# Patient Record
Sex: Male | Born: 1970 | Race: Black or African American | Hispanic: No | Marital: Married | Smoking: Never smoker
Health system: Southern US, Community
[De-identification: ages and names within clinical notes are randomized; demographics above are authoritative.]

## PROBLEM LIST (undated history)

## (undated) DIAGNOSIS — C801 Malignant (primary) neoplasm, unspecified: Secondary | ICD-10-CM

## (undated) DIAGNOSIS — M1990 Unspecified osteoarthritis, unspecified site: Secondary | ICD-10-CM

## (undated) DIAGNOSIS — E119 Type 2 diabetes mellitus without complications: Secondary | ICD-10-CM

## (undated) DIAGNOSIS — I10 Essential (primary) hypertension: Secondary | ICD-10-CM

## (undated) HISTORY — PX: NO PAST SURGERIES: SHX2092

---

## 2006-04-23 ENCOUNTER — Ambulatory Visit (HOSPITAL_BASED_OUTPATIENT_CLINIC_OR_DEPARTMENT_OTHER): Admission: RE | Admit: 2006-04-23 | Discharge: 2006-04-23 | Payer: Self-pay | Admitting: Urology

## 2013-04-30 ENCOUNTER — Telehealth: Payer: Self-pay | Admitting: Family Medicine

## 2013-05-05 NOTE — Telephone Encounter (Signed)
Give janumet samples and have patient make appointment Needs to have labs in order to get 90 day supply.

## 2013-05-05 NOTE — Telephone Encounter (Signed)
Last A1C 09/13

## 2015-09-03 ENCOUNTER — Ambulatory Visit (INDEPENDENT_AMBULATORY_CARE_PROVIDER_SITE_OTHER): Payer: 59 | Admitting: Family Medicine

## 2015-09-03 ENCOUNTER — Ambulatory Visit (INDEPENDENT_AMBULATORY_CARE_PROVIDER_SITE_OTHER): Payer: 59

## 2015-09-03 VITALS — BP 117/76 | HR 60 | Temp 97.9°F | Ht 66.0 in | Wt 237.0 lb

## 2015-09-03 DIAGNOSIS — M6588 Other synovitis and tenosynovitis, other site: Secondary | ICD-10-CM | POA: Diagnosis not present

## 2015-09-03 DIAGNOSIS — M79672 Pain in left foot: Secondary | ICD-10-CM

## 2015-09-03 DIAGNOSIS — M7752 Other enthesopathy of left foot: Secondary | ICD-10-CM

## 2015-09-03 MED ORDER — DICLOFENAC SODIUM 75 MG PO TBEC: 75.0000 mg | DELAYED_RELEASE_TABLET | Freq: Two times a day (BID) | ORAL | Status: DC

## 2015-09-03 NOTE — Progress Notes (Signed)
   Subjective:  Patient ID: Andres Carter, male    DOB: 04-Nov-1971  Age: 44 y.o. MRN: [id number]  CC: Foot Pain   HPI Andres Carter presents for increasing pain at the posterior foot near the achilles.Hurts to walk on it for extended times. Not painful at rest. NKI. Has to waslk a lot.  History Andres Carter has no past medical history on file.   He has no past surgical history on file.   His family history includes Cancer in his father; Diabetes in his father and mother.He reports that he has never smoked. He does not have any smokeless tobacco history on file. He reports that he does not drink alcohol or use illicit drugs.  No outpatient prescriptions prior to visit.   No facility-administered medications prior to visit.    ROS Review of Systems  Constitutional: Negative for fever, chills and diaphoresis.  HENT: Negative for congestion, rhinorrhea and sore throat.   Respiratory: Negative for cough, shortness of breath and wheezing.   Cardiovascular: Negative for chest pain.  Gastrointestinal: Negative for nausea, vomiting, abdominal pain, diarrhea, constipation and abdominal distention.  Genitourinary: Negative for dysuria and frequency.  Musculoskeletal: Negative for joint swelling and arthralgias.  Skin: Negative for rash.  Neurological: Negative for headaches.    Objective:  BP 117/76 mmHg  Pulse 60  Temp(Src) 97.9 F (36.6 C) (Oral)  Ht 5\' 6"  (1.676 m)  Wt 237 lb (107.502 kg)  BMI 38.27 kg/m2  BP Readings from Last 3 Encounters:  09/03/15 117/76    Wt Readings from Last 3 Encounters:  09/03/15 237 lb (107.502 kg)     Physical Exam  Constitutional: He appears well-developed and well-nourished.  HENT:  Head: Normocephalic and atraumatic.  Right Ear: External ear normal.  Left Ear: External ear normal.  Mouth/Throat: No oropharyngeal exudate or posterior oropharyngeal erythema.  Eyes: Pupils are equal, round, and reactive to light.  Neck: Normal  range of motion. Neck supple.  Cardiovascular: Normal rate and regular rhythm.   No murmur heard. Pulmonary/Chest: Breath sounds normal. No respiratory distress.  Abdominal: Bowel sounds are normal.  Musculoskeletal: He exhibits tenderness (medial aspect of the left ankle at the achilles.).  Vitals reviewed.   No results found for: HGBA1C  No results found for: WBC, HGB, HCT, PLT, GLUCOSE, CHOL, TRIG, HDL, LDLDIRECT, LDLCALC, ALT, AST, NA, K, CL, CREATININE, BUN, CO2, TSH, PSA, INR, GLUF, HGBA1C, MICROALBUR  No results found.  Assessment & Plan:   Andres Carter was seen today for foot pain.  Diagnoses and all orders for this visit:  Tendonitis of ankle, left -     Apply ASO ankle  Left foot pain -     DG Foot Complete Left -     Apply ASO ankle  Other orders -     diclofenac (VOLTAREN) 75 MG EC tablet; Take 1 tablet (75 mg total) by mouth 2 (two) times daily.   I am having Andres Carter start on diclofenac.  Meds ordered this encounter  Medications  . diclofenac (VOLTAREN) 75 MG EC tablet    Sig: Take 1 tablet (75 mg total) by mouth 2 (two) times daily.    Dispense:  60 tablet    Refill:  2     Follow-up: Return if symptoms worsen or fail to improve.  Claretta Fraise, M.D.

## 2016-12-13 IMAGING — CR DG FOOT COMPLETE 3+V*L*
3 series · 3 of 3 positions shown · non-contrast
Comparison: None.

CLINICAL DATA: Left foot pain

EXAM:
LEFT FOOT - COMPLETE 3+ VIEW

[view not recorded (1 of 3)]
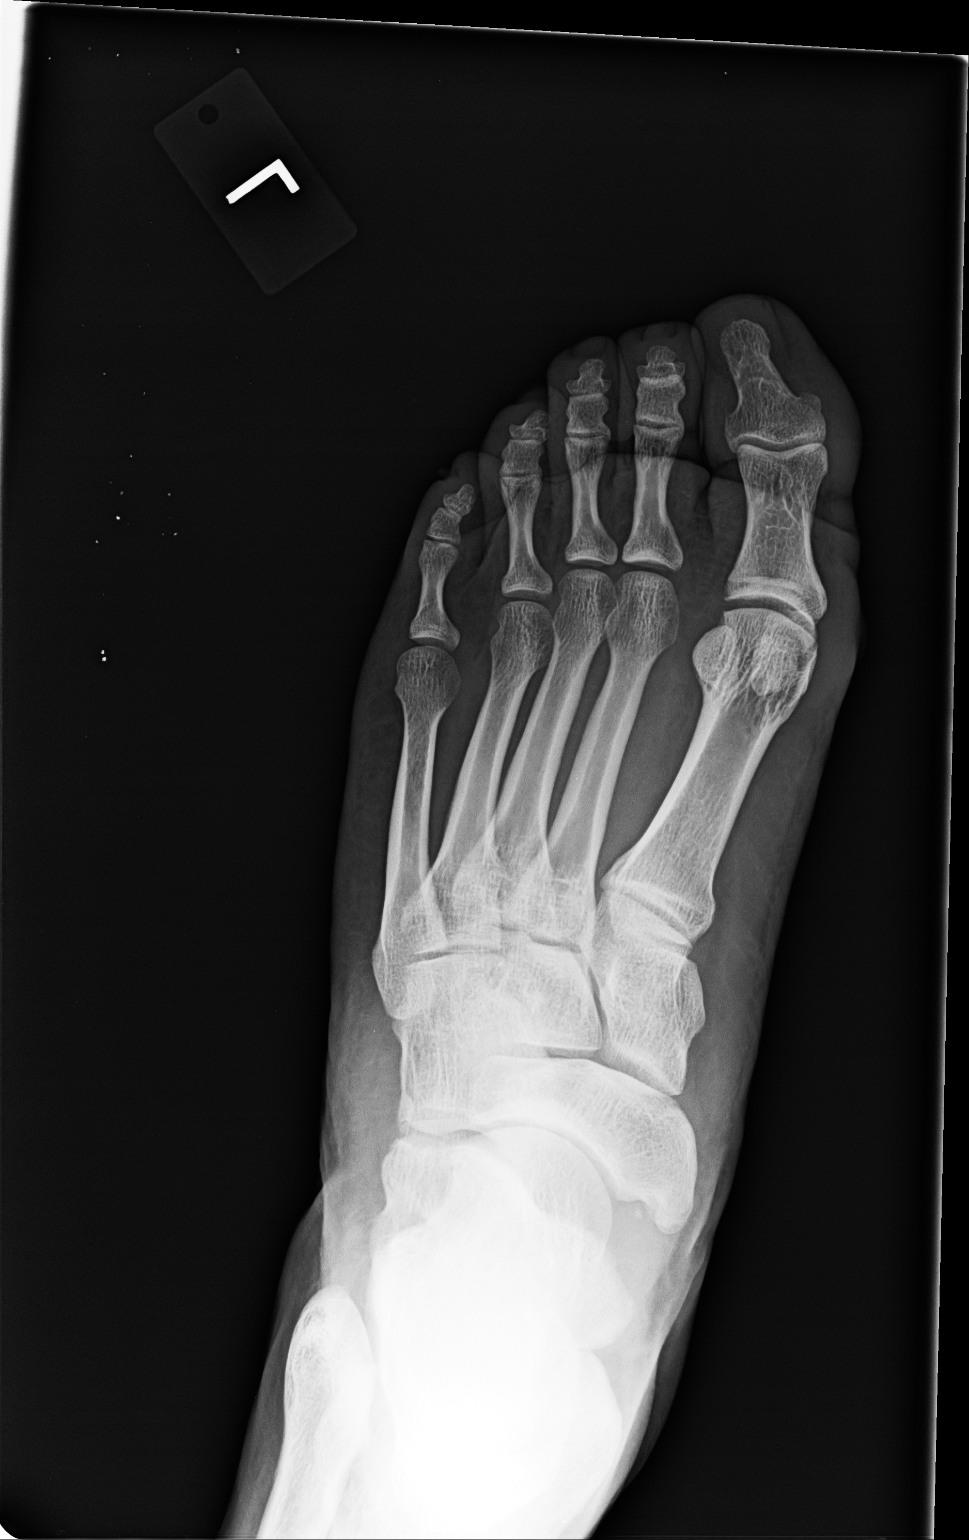

[view not recorded (2 of 3)]
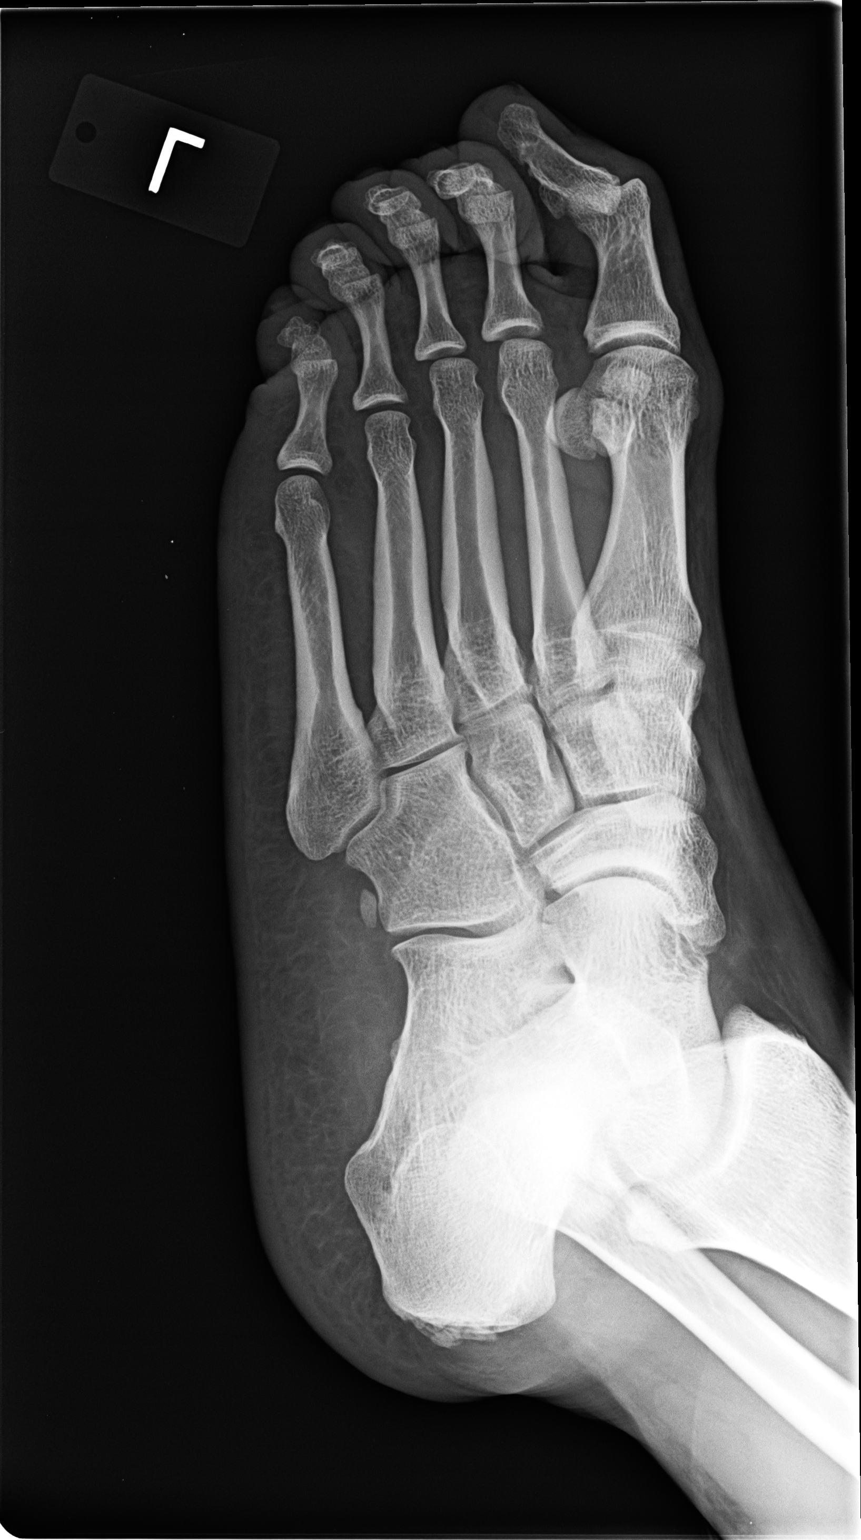

[view not recorded (3 of 3)]
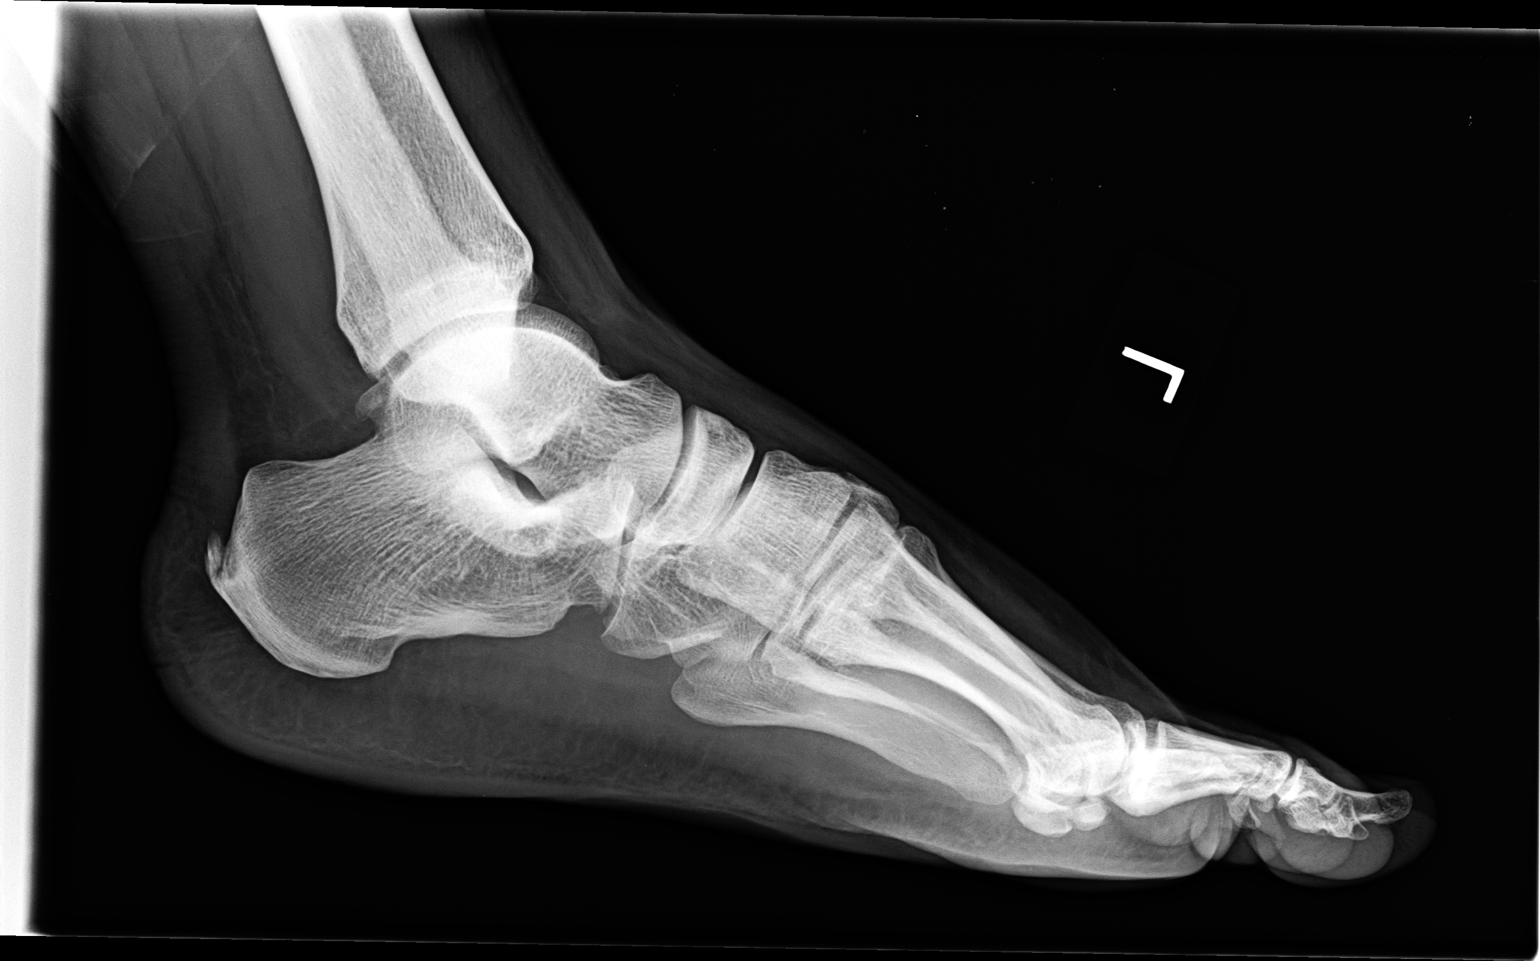

[3 of 3 positions shown; findings below may reference images not displayed]

FINDINGS: Normal alignment. No fracture or arthropathy. Mild spurring at the
Achilles tendon insertion on the calcaneus.
IMPRESSION: Mild calcaneal spurring.  No acute bony abnormality.

## 2022-03-09 DIAGNOSIS — E1165 Type 2 diabetes mellitus with hyperglycemia: Secondary | ICD-10-CM | POA: Insufficient documentation

## 2022-03-09 DIAGNOSIS — R7989 Other specified abnormal findings of blood chemistry: Secondary | ICD-10-CM | POA: Insufficient documentation

## 2022-03-09 DIAGNOSIS — E785 Hyperlipidemia, unspecified: Secondary | ICD-10-CM | POA: Insufficient documentation

## 2022-04-18 ENCOUNTER — Ambulatory Visit: Payer: Self-pay | Admitting: Urology

## 2022-05-01 ENCOUNTER — Ambulatory Visit: Payer: Self-pay | Admitting: Urology

## 2022-05-08 ENCOUNTER — Encounter: Payer: Self-pay | Admitting: Urology

## 2022-05-08 ENCOUNTER — Ambulatory Visit (INDEPENDENT_AMBULATORY_CARE_PROVIDER_SITE_OTHER): Payer: 59 | Admitting: Urology

## 2022-05-08 VITALS — BP 117/73 | HR 111 | Ht 66.5 in | Wt 215.0 lb

## 2022-05-08 DIAGNOSIS — R972 Elevated prostate specific antigen [PSA]: Secondary | ICD-10-CM

## 2022-05-08 LAB — MICROSCOPIC EXAMINATION
Bacteria, UA: NONE SEEN
Renal Epithel, UA: NONE SEEN /hpf
WBC, UA: NONE SEEN /hpf (ref 0–5)

## 2022-05-08 LAB — URINALYSIS, ROUTINE W REFLEX MICROSCOPIC
Bilirubin, UA: NEGATIVE
Glucose, UA: NEGATIVE
Leukocytes,UA: NEGATIVE
Nitrite, UA: NEGATIVE
Specific Gravity, UA: 1.025 (ref 1.005–1.030)
Urobilinogen, Ur: 0.2 mg/dL (ref 0.2–1.0)
pH, UA: 5 (ref 5.0–7.5)

## 2022-05-08 MED ORDER — LEVOFLOXACIN 750 MG PO TABS: 750.0000 mg | ORAL_TABLET | Freq: Once | ORAL | 0 refills | Status: AC

## 2022-05-08 NOTE — Progress Notes (Signed)
05/08/2022 3:50 PM   Andres Carter 1971/01/07 [id number]  Referring provider: Claretta Fraise, MD [place],  [place] [address]  Elevated PSA   HPI:  Andres Carter is a 51yo here for evaluation of elevated PSA. PSA 14.6 in 03/2022. No prior PSAs. No family hx of prostate cancer. No significant LUTS. IPSS 14 QOL 0. No other complaints today.   PMH: No past medical history on file.  Surgical History:   Home Medications:  Allergies as of 05/08/2022   No Known Allergies      Medication List        Accurate as of May 08, 2022  3:50 PM. If you have any questions, ask your nurse or doctor.          atorvastatin 10 MG tablet Commonly known as: LIPITOR Take 10 mg by mouth at bedtime.   B-12 500 MCG Subl Place 1 tablet under the tongue daily.   diclofenac 75 MG EC tablet Commonly known as: VOLTAREN Take 1 tablet (75 mg total) by mouth 2 (two) times daily.   gabapentin 100 MG capsule Commonly known as: NEURONTIN TAKE 1 CAPSULE BY MOUTH NIGHTLY AS NEEDED   lisinopril 5 MG tablet Commonly known as: ZESTRIL Take 5 mg by mouth daily.   metFORMIN 500 MG tablet Commonly known as: GLUCOPHAGE Take 500 mg by mouth 2 (two) times daily.   Ozempic (0.25 or 0.5 MG/DOSE) 2 MG/1.5ML Sopn Generic drug: Semaglutide(0.25 or 0.'5MG'$ /DOS) SMARTSIG:0.25 Milligram(s) SUB-Q Once a Week   sildenafil 100 MG tablet Commonly known as: VIAGRA Take 100 mg by mouth daily.        Allergies: No Known Allergies  Family History: Family History  Problem Relation Age of Onset   Diabetes Mother    Cancer Father    Diabetes Father     Social History:  reports that he has never smoked. He does not have any smokeless tobacco history on file. He reports that he does not drink alcohol and does not use drugs.  ROS: All other review of systems were reviewed and are negative except what is noted above in HPI  Physical Exam: BP 117/73   Pulse (!) 111   Ht 5' 6.5" (1.689 m)    Wt 215 lb (97.5 kg)   BMI 34.18 kg/m   Constitutional:  Alert and oriented, No acute distress. HEENT: [place] AT, moist mucus membranes.  Trachea midline, no masses. Cardiovascular: No clubbing, cyanosis, or edema. Respiratory: Normal respiratory effort, no increased work of breathing. GI: Abdomen is soft, nontender, nondistended, no abdominal masses GU: No CVA tenderness. Circumcised phallus. No masses/lesions on penis, testis, scrotum. Prostate 40g smooth no nodules no induration.  Lymph: No cervical or inguinal lymphadenopathy. Skin: No rashes, bruises or suspicious lesions. Neurologic: Grossly intact, no focal deficits, moving all 4 extremities. Psychiatric: Normal mood and affect.  Laboratory Data: No results found for: "WBC", "HGB", "HCT", "MCV", "PLT"  No results found for: "CREATININE"  No results found for: "PSA"  No results found for: "TESTOSTERONE"  No results found for: "HGBA1C"  Urinalysis No results found for: "COLORURINE", "APPEARANCEUR", "LABSPEC", "PHURINE", "GLUCOSEU", "HGBUR", "BILIRUBINUR", "KETONESUR", "PROTEINUR", "UROBILINOGEN", "NITRITE", "LEUKOCYTESUR"  No results found for: "LABMICR", "WBCUA", "RBCUA", "LABEPIT", "MUCUS", "BACTERIA"  Pertinent Imaging:  No results found for this or any previous visit.  No results found for this or any previous visit.  No results found for this or any previous visit.  No results found for this or any previous visit.  No  results found for this or any previous visit.  No results found for this or any previous visit.  No results found for this or any previous visit.  No results found for this or any previous visit.   Assessment & Plan:    1. Elevated PSA The patient and I talked about etiologies of elevated PSA.  We discussed the possible relationship between elevated PSA, prostate cancer, BPH, prostatitis, and UTI.   Conservative treatment of elevated PSA with watchful waiting was discussed with the patient.   All questions were answered.        All of the risks and benefits along with alternatives to prostate biopsy were discussed with the patient.  The patient gave fully informed consent to proceed with a transrectal ultrasound guided biopsy of the prostate for the evaluation of their evated PSA.  Prostate biopsy instructions and antibiotics were given to the patient.  - Urinalysis, Routine w reflex microscopic   No follow-ups on file.  Nicolette Bang, MD  [facility] [place]

## 2022-05-24 ENCOUNTER — Encounter (HOSPITAL_COMMUNITY): Payer: Self-pay

## 2022-05-24 ENCOUNTER — Ambulatory Visit (HOSPITAL_BASED_OUTPATIENT_CLINIC_OR_DEPARTMENT_OTHER): Payer: 59 | Admitting: Urology

## 2022-05-24 ENCOUNTER — Encounter: Payer: Self-pay | Admitting: Urology

## 2022-05-24 ENCOUNTER — Ambulatory Visit (HOSPITAL_COMMUNITY)
Admission: RE | Admit: 2022-05-24 | Discharge: 2022-05-24 | Disposition: A | Discharge status: Home / Self Care | Payer: 59 | Source: Ambulatory Visit | Attending: Urology | Admitting: Urology

## 2022-05-24 ENCOUNTER — Other Ambulatory Visit: Payer: Self-pay | Admitting: Urology

## 2022-05-24 DIAGNOSIS — R972 Elevated prostate specific antigen [PSA]: Secondary | ICD-10-CM | POA: Insufficient documentation

## 2022-05-24 DIAGNOSIS — C61 Malignant neoplasm of prostate: Secondary | ICD-10-CM

## 2022-05-24 MED ORDER — GENTAMICIN SULFATE 40 MG/ML IJ SOLN
INTRAMUSCULAR | Status: AC
  Administered 2022-05-24: 80 mg via INTRAMUSCULAR
  Filled 2022-05-24: qty 2

## 2022-05-24 MED ORDER — GENTAMICIN SULFATE 40 MG/ML IJ SOLN: 80.0000 mg | Freq: Once | INTRAMUSCULAR | Status: AC

## 2022-05-24 MED ORDER — LIDOCAINE HCL (PF) 2 % IJ SOLN: 10.0000 mL | Freq: Once | INTRAMUSCULAR | Status: AC

## 2022-05-24 MED ORDER — LIDOCAINE HCL (PF) 2 % IJ SOLN
INTRAMUSCULAR | Status: AC
  Administered 2022-05-24: 10 mL
  Filled 2022-05-24: qty 10

## 2022-05-24 NOTE — Progress Notes (Signed)
Prostate Biopsy Procedure   Informed consent was obtained after discussing risks/benefits of the procedure.  A time out was performed to ensure correct patient identity.  Pre-Procedure: - Last PSA Level: No results found for: "PSA" - Gentamicin given prophylactically - Levaquin 500 mg administered PO -Transrectal Ultrasound performed revealing a 25.3 gm prostate -No significant hypoechoic or median lobe noted  Procedure: - Prostate block performed using 10 cc 1% lidocaine and biopsies taken from sextant areas, a total of 12 under ultrasound guidance.  Post-Procedure: - Patient tolerated the procedure well - He was counseled to seek immediate medical attention if experiences any severe pain, significant bleeding, or fevers - Return in one week to discuss biopsy results

## 2022-05-24 NOTE — Patient Instructions (Signed)
Transrectal Ultrasound-Guided Prostate Biopsy, Care After What can I expect after the procedure? After the procedure, it is common to have: Pain and discomfort near your butt (rectum), especially while sitting. Pink-colored pee (urine). This is due to small amounts of blood in your pee. A burning feeling while peeing. Blood in your poop (stool). Bleeding from your butt. Blood in your semen. Follow these instructions at home: Medicines Take over-the-counter and prescription medicines only as told by your doctor. If you were given a sedative during your procedure, do not drive or use machines until your doctor says that it is safe. A sedative is a medicine that helps you relax. If you were prescribed an antibiotic medicine, take it as told by your doctor. Do not stop taking it even if you start to feel better. Activity  Return to your normal activities when your doctor says that it is safe. Ask your doctor when it is okay for you to have sex. You may have to avoid lifting. Ask your doctor how much you can safely lift. General instructions  Drink enough water to keep your pee pale yellow. Watch your pee, poop, and semen for new bleeding or bleeding that gets worse. Keep all follow-up visits. Contact a doctor if: You have any of these: Blood clots in your pee or poop. Blood in your pee more than 2 weeks after the procedure. Blood in your semen more than 2 months after the procedure. New or worse bleeding in your pee, poop, or semen. Very bad belly pain. Your pee smells bad or unusual. You have trouble peeing. Your lower belly feels firm. You have problems getting an erection. You feel like you may vomit (are nauseous), or you vomit. Get help right away if: You have a fever or chills. You have bright red pee. You have very bad pain that does not get better with medicine. You cannot pee. Summary After this procedure, it is common to have pain and discomfort near your butt,  especially while sitting. You may have blood in your pee and poop. It is common to have blood in your semen. Get help right away if you have a fever or chills. This information is not intended to replace advice given to you by your health care provider. Make sure you discuss any questions you have with your health care provider. Document Revised: 04/25/2021 Document Reviewed: 04/25/2021 Elsevier Patient Education  2023 [employer]vier Inc.  

## 2022-05-24 NOTE — Progress Notes (Signed)
PT tolerated prostate biopsy procedure and antibiotic injection well today. Labs obtained and sent for pathology. PT ambulatory at discharge with no acute distress noted and verbalized understanding of discharge instructions. PT to follow up with urologist as scheduled on 06/05/22 at 4:00pm.

## 2022-05-29 ENCOUNTER — Other Ambulatory Visit: Payer: Self-pay | Admitting: Urology

## 2022-05-31 ENCOUNTER — Ambulatory Visit: Payer: Self-pay | Admitting: Urology

## 2022-06-05 ENCOUNTER — Encounter: Payer: Self-pay | Admitting: Urology

## 2022-06-05 ENCOUNTER — Ambulatory Visit (INDEPENDENT_AMBULATORY_CARE_PROVIDER_SITE_OTHER): Payer: 59 | Admitting: Urology

## 2022-06-05 VITALS — BP 151/82 | HR 116

## 2022-06-05 DIAGNOSIS — C61 Malignant neoplasm of prostate: Secondary | ICD-10-CM | POA: Diagnosis not present

## 2022-06-05 NOTE — Patient Instructions (Signed)

## 2022-06-05 NOTE — Progress Notes (Signed)
06/05/2022 4:07 PM   Duffy Rhody 1971-09-29 [id number]  Referring provider: Claretta Fraise, MD [place],  [place] [id number]  Followup prostate biopsy   HPI: Mr Hewins is a 51yo here for followup after prostate biopsy. Biopsy revealed Gleason 4+3=7 in 6/12 cores and Gleason 3+4=7 in 6/12 cores. PSA 14.6. No significant LUTS.    PMH: No past medical history on file.  Surgical History: No past surgical history on file.  Home Medications:  Allergies as of 06/05/2022   No Known Allergies      Medication List        Accurate as of June 05, 2022  4:07 PM. If you have any questions, ask your nurse or doctor.          atorvastatin 10 MG tablet Commonly known as: LIPITOR Take 10 mg by mouth at bedtime.   B-12 500 MCG Subl Place 1 tablet under the tongue daily.   diclofenac 75 MG EC tablet Commonly known as: VOLTAREN Take 1 tablet (75 mg total) by mouth 2 (two) times daily.   gabapentin 100 MG capsule Commonly known as: NEURONTIN TAKE 1 CAPSULE BY MOUTH NIGHTLY AS NEEDED   lisinopril 5 MG tablet Commonly known as: ZESTRIL Take 5 mg by mouth daily.   metFORMIN 500 MG tablet Commonly known as: GLUCOPHAGE Take 500 mg by mouth 2 (two) times daily.   Ozempic (0.25 or 0.5 MG/DOSE) 2 MG/1.5ML Sopn Generic drug: Semaglutide(0.25 or 0.'5MG'$ /DOS) SMARTSIG:0.25 Milligram(s) SUB-Q Once a Week   sildenafil 100 MG tablet Commonly known as: VIAGRA Take 100 mg by mouth daily.        Allergies: No Known Allergies  Family History: Family History  Problem Relation Age of Onset   Diabetes Mother    Cancer Father    Diabetes Father     Social History:  reports that he has never smoked. He does not have any smokeless tobacco history on file. He reports that he does not drink alcohol and does not use drugs.  ROS: All other review of systems were reviewed and are negative except what is noted above in HPI  Physical Exam: BP (!) 151/82   Pulse  (!) 116   Constitutional:  Alert and oriented, No acute distress. HEENT: [place] AT, moist mucus membranes.  Trachea midline, no masses. Cardiovascular: No clubbing, cyanosis, or edema. Respiratory: Normal respiratory effort, no increased work of breathing. GI: Abdomen is soft, nontender, nondistended, no abdominal masses GU: No CVA tenderness.  Lymph: No cervical or inguinal lymphadenopathy. Skin: No rashes, bruises or suspicious lesions. Neurologic: Grossly intact, no focal deficits, moving all 4 extremities. Psychiatric: Normal mood and affect.  Laboratory Data: No results found for: "WBC", "HGB", "HCT", "MCV", "PLT"  No results found for: "CREATININE"  No results found for: "PSA"  No results found for: "TESTOSTERONE"  No results found for: "HGBA1C"  Urinalysis    Component Value Date/Time   APPEARANCEUR Clear 05/08/2022 1629   GLUCOSEU Negative 05/08/2022 1629   BILIRUBINUR Negative 05/08/2022 1629   PROTEINUR 1+ (A) 05/08/2022 1629   NITRITE Negative 05/08/2022 1629   LEUKOCYTESUR Negative 05/08/2022 1629    Lab Results  Component Value Date   LABMICR See below: 05/08/2022   WBCUA None seen 05/08/2022   LABEPIT 0-10 05/08/2022   MUCUS Present 05/08/2022   BACTERIA None seen 05/08/2022    Pertinent Imaging:  No results found for this or any previous visit.  No results found for this or any previous visit.  No results found for this or any previous visit.  No results found for this or any previous visit.  No results found for this or any previous visit.  No results found for this or any previous visit.  No results found for this or any previous visit.  No results found for this or any previous visit.   Assessment & Plan:    1. Prostate cancer [facility]) I discussed the natural history of unfavorable intermediate/high risk prostate cancer with the patient and the various treatment options including active surveillance, RALP, IMRT, brachytherapy, cryotherapy,  HIFU and ADT. We will obtain CT and bone scan prior to his appointment with Dr. Tresa Moore at [facility] and Dr. Tammi Klippel at [facility]   No follow-ups on file.  Nicolette Bang, MD  [facility] [place]

## 2022-06-07 ENCOUNTER — Ambulatory Visit (HOSPITAL_COMMUNITY)
Admission: RE | Admit: 2022-06-07 | Discharge: 2022-06-07 | Disposition: A | Discharge status: Home / Self Care | Payer: 59 | Source: Ambulatory Visit | Attending: Urology | Admitting: Urology

## 2022-06-07 ENCOUNTER — Ambulatory Visit: Payer: Self-pay | Admitting: Urology

## 2022-06-07 ENCOUNTER — Encounter (HOSPITAL_COMMUNITY): Payer: Self-pay

## 2022-06-07 DIAGNOSIS — C61 Malignant neoplasm of prostate: Secondary | ICD-10-CM | POA: Insufficient documentation

## 2022-06-07 LAB — POCT I-STAT CREATININE: Creatinine, Ser: 1.4 mg/dL — ABNORMAL HIGH (ref 0.61–1.24)

## 2022-06-07 MED ORDER — IOHEXOL 300 MG/ML  SOLN
100.0000 mL | Freq: Once | INTRAMUSCULAR | Status: AC | PRN
  Administered 2022-06-07: 100 mL via INTRAVENOUS

## 2022-06-08 LAB — BASIC METABOLIC PANEL
BUN/Creatinine Ratio: 12 (ref 9–20)
BUN: 16 mg/dL (ref 6–24)
CO2: 22 mmol/L (ref 20–29)
Calcium: 9.4 mg/dL (ref 8.7–10.2)
Chloride: 100 mmol/L (ref 96–106)
Creatinine, Ser: 1.34 mg/dL — ABNORMAL HIGH (ref 0.76–1.27)
Glucose: 169 mg/dL — ABNORMAL HIGH (ref 70–99)
Potassium: 4.6 mmol/L (ref 3.5–5.2)
Sodium: 139 mmol/L (ref 134–144)
eGFR: 65 mL/min/{1.73_m2} (ref >59)

## 2022-06-16 ENCOUNTER — Telehealth: Payer: Self-pay | Admitting: Radiation Oncology

## 2022-06-16 NOTE — Telephone Encounter (Signed)
8/4 @ 8:26 am Left voicemail for patient to call our office to be schedule for an consult with Dr. Tammi Klippel.

## 2022-06-16 NOTE — Telephone Encounter (Signed)
8/4 @ 3:27 pm patient called back left voicemail.  Return call went straight to voicemail.

## 2022-06-19 NOTE — Progress Notes (Signed)
GU Location of Tumor / Histology: Prostate Ca  If Prostate Cancer, Gleason Score is (4 + 3) and PSA is (14.6)  Biopsy revealed Gleason 4+3=7 in 6/12 cores and Gleason 3+4=7 in 6/12 cores. PSA 14.6. No significant LUTS.   06/05/2022 Dr. Alyson Ingles CT Abdomen with/without Contrast CLINICAL DATA:  New diagnosis high risk prostate cancer staging * Tracking Code: BO *  FINDINGS: Lower chest: No acute abnormality.   Hepatobiliary: Numerous hypoenhancing liver lesions, none of which are clearly circumscribed, fluid attenuation cysts. Index lesion in the posterior liver dome measures 1.2 x 1.0 cm (series 8, image 11). Index lesion of the inferior left lobe of the liver, hepatic segment IV B measures 1.1 x 1.1 cm (series 8, image 22). No gallstones, gallbladder wall thickening, or biliary dilatation.   Pancreas: Unremarkable. No pancreatic ductal dilatation or surrounding inflammatory changes.   Spleen: Normal in size without significant abnormality.   Adrenals/Urinary Tract: Rim calcified, lobulated appearance of the right adrenal gland consistent with prior infection or hemorrhage (series 3, image 21). The left adrenal gland is normal. Kidneys are normal, without renal calculi, solid lesion, or hydronephrosis. Thickening of the urinary bladder wall.   Stomach/Bowel: Stomach is within normal limits. Appendix appears normal. No evidence of bowel wall thickening, distention, or inflammatory changes.   Vascular/Lymphatic: No significant vascular findings are present.  Numerous prominent retroperitoneal and bilateral iliac lymph nodes, with overtly enlarged left iliac, pelvic sidewall, and left inguinal lymph nodes, largest left inguinal nodes measuring 3.5 x 1.9 cm (series 8, image 90).   Reproductive: No mass or other significant abnormality.   Other: No abdominal wall hernia or abnormality. No ascites.   Musculoskeletal: No acute or significant osseous findings.   IMPRESSION: 1. Numerous  prominent retroperitoneal and bilateral iliac lymph nodes, with overtly enlarged left iliac, pelvic sidewall, and left inguinal lymph nodes, largest left inguinal nodes measuring 3.5 x 1.9 cm. 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 2. Numerous small hypoenhancing liver lesions, none of which are clearly circumscribed, fluid attenuation cysts. Although hepatic metastatic disease is uncommon in the setting of prostate malignancy, these are highly suspicious for metastases given clear evidence of nodal metastatic disease. 3. No CT evidence of osseous metastatic disease. Please note that nuclear scintigraphic bone scan or PET-CT are more sensitive for osseous metastatic disease prior to treatment of prostate malignancy. 4. Thickening of the urinary bladder wall, likely due to chronic outlet obstruction.  Past/Anticipated interventions by urology, if any: NA  Past/Anticipated interventions by medical oncology, if any: NA  Weight changes, if any: {:18581}  IPPS: SHIM:  Bowel/Bladder complaints, if any: {:18581}   Nausea/Vomiting, if any: {:18581}  Pain issues, if any:  {:18581}  SAFETY ISSUES: Prior radiation? {:18581} Pacemaker/ICD? {:18581} Possible current pregnancy? Male Is the patient on methotrexate? No  Current Complaints / other details:  ***

## 2022-06-20 ENCOUNTER — Encounter (HOSPITAL_COMMUNITY)
Admission: RE | Admit: 2022-06-20 | Discharge: 2022-06-20 | Disposition: A | Discharge status: Home / Self Care | Payer: 59 | Source: Ambulatory Visit | Attending: Urology | Admitting: Urology

## 2022-06-20 ENCOUNTER — Encounter (HOSPITAL_COMMUNITY): Payer: Self-pay

## 2022-06-20 ENCOUNTER — Ambulatory Visit (HOSPITAL_COMMUNITY)
Admission: RE | Admit: 2022-06-20 | Discharge: 2022-06-20 | Disposition: A | Discharge status: Home / Self Care | Payer: 59 | Source: Ambulatory Visit | Attending: Urology | Admitting: Urology

## 2022-06-20 DIAGNOSIS — C61 Malignant neoplasm of prostate: Secondary | ICD-10-CM | POA: Diagnosis present

## 2022-06-20 HISTORY — DX: Malignant (primary) neoplasm, unspecified: C80.1

## 2022-06-20 HISTORY — DX: Type 2 diabetes mellitus without complications: E11.9

## 2022-06-20 HISTORY — DX: Essential (primary) hypertension: I10

## 2022-06-20 MED ORDER — TECHNETIUM TC 99M MEDRONATE IV KIT
20.0000 | PACK | Freq: Once | INTRAVENOUS | Status: AC | PRN
  Administered 2022-06-20: 20.6 via INTRAVENOUS

## 2022-06-21 ENCOUNTER — Other Ambulatory Visit: Payer: Self-pay

## 2022-06-21 ENCOUNTER — Encounter: Payer: Self-pay | Admitting: Radiation Oncology

## 2022-06-21 ENCOUNTER — Ambulatory Visit
Admission: RE | Admit: 2022-06-21 | Discharge: 2022-06-21 | Disposition: A | Discharge status: Home / Self Care | Payer: 59 | Source: Ambulatory Visit | Attending: Radiation Oncology | Admitting: Radiation Oncology

## 2022-06-21 DIAGNOSIS — Z809 Family history of malignant neoplasm, unspecified: Secondary | ICD-10-CM | POA: Insufficient documentation

## 2022-06-21 DIAGNOSIS — E119 Type 2 diabetes mellitus without complications: Secondary | ICD-10-CM | POA: Insufficient documentation

## 2022-06-21 DIAGNOSIS — C779 Secondary and unspecified malignant neoplasm of lymph node, unspecified: Secondary | ICD-10-CM | POA: Diagnosis not present

## 2022-06-21 DIAGNOSIS — C61 Malignant neoplasm of prostate: Secondary | ICD-10-CM | POA: Insufficient documentation

## 2022-06-21 DIAGNOSIS — I10 Essential (primary) hypertension: Secondary | ICD-10-CM | POA: Insufficient documentation

## 2022-06-21 DIAGNOSIS — Z79899 Other long term (current) drug therapy: Secondary | ICD-10-CM | POA: Diagnosis not present

## 2022-06-21 NOTE — Progress Notes (Signed)
Radiation Oncology         [phone] ________________________________  Initial Outpatient Consultation  Name: Andres Carter MRN: [id number]  Date: 06/21/2022  DOB: 11/19/1970  [id number], [name], MD  McKenzie, Candee Furbish, MD   REFERRING PHYSICIAN: Cleon Gustin, MD  DIAGNOSIS: 51 y.o. gentleman with what may be locally advanced, Stage IVA, cT1c N1, adenocarcinoma of the prostate with Gleason Score of 4+3, PSA of 14.6 and evidence of nodal metastases, possibly liver mets, on staging CT A/P, pending PSMA-PET and/or lymph node biopsy.    ICD-10-CM   1. Malignant neoplasm of prostate ([place])  C61       HISTORY OF PRESENT ILLNESS: Andres Carter is a 51 y.o. male with a diagnosis of prostate cancer. He was noted to have an elevated PSA of 14.6 by his primary care physician, Dr. Ileana Roup.  Accordingly, he was referred for evaluation in urology by Dr. Alyson Ingles on 05/08/22,  digital rectal examination was performed at that time revealing no nodules.  The patient proceeded to transrectal ultrasound with 12 biopsies of the prostate on 05/24/22.  The prostate volume measured 25.3 cc.  Out of 12 core biopsies, all 12 were positive.  The maximum Gleason score was 4+3, and this was seen in all 6 left-sided cores (with 5 showing perineural invasion). Additionally, Gleason 3+4 was seen in all 6 right-sided cores (with 3 showing PNI).   He underwent staging CT A/P on 06/07/22 showing numerous prominent retroperitoneal and bilateral iliac lymph nodes, with overtly enlarged left iliac, pelvic sidewall, and left inguinal lymph nodes consistent with nodal metastases. Additionally, there were numerous small hypoenhancing liver lesions, highly suspicious for metastases but no evidence of osseous metastatic disease. Staging bone scan on 06/20/22 was also negative for osseous metastatic disease. He is here today, accompanied by his wife, Leana Roe.  The patient reviewed the biopsy results with his urologist  and he has kindly been referred today for discussion of potential radiation treatment options. He has also been referred to Dr. Tresa Moore to discuss surgical treatment options and is scheduled for consult 07/04/22.   PREVIOUS RADIATION THERAPY: No  PAST MEDICAL HISTORY:  Past Medical History:  Diagnosis Date   Cancer ([place])    Prostate   Diabetes mellitus without complication ([place])    Hypertension       PAST SURGICAL HISTORY:No past surgical history on file.  FAMILY HISTORY:  Family History  Problem Relation Age of Onset   Diabetes Mother    Cancer Father    Diabetes Father     SOCIAL HISTORY:  Social History   Socioeconomic History   Marital status: Single    Spouse name: Not on file   Number of children: Not on file   Years of education: Not on file   Highest education level: Not on file  Occupational History   Not on file  Tobacco Use   Smoking status: Never   Smokeless tobacco: Not on file  Vaping Use   Vaping Use: Never used  Substance and Sexual Activity   Alcohol use: No    Alcohol/week: 0.0 standard drinks of alcohol   Drug use: No   Sexual activity: Not on file  Other Topics Concern   Not on file  Social History Narrative   Not on file   Social Determinants of Health   Financial Resource Strain: Not on file  Food Insecurity: Not on file  Transportation Needs: Not on file  Physical Activity: Not on file  Stress:  Not on file  Social Connections: Not on file  Intimate Partner Violence: Not on file    ALLERGIES: Patient has no known allergies.  MEDICATIONS:  Current Outpatient Medications  Medication Sig Dispense Refill   atorvastatin (LIPITOR) 10 MG tablet Take 10 mg by mouth at bedtime.     Cyanocobalamin (B-12) 500 MCG SUBL Place 1 tablet under the tongue daily.     diclofenac (VOLTAREN) 75 MG EC tablet Take 1 tablet (75 mg total) by mouth 2 (two) times daily. 60 tablet 2   gabapentin (NEURONTIN) 100 MG capsule TAKE 1 CAPSULE BY MOUTH NIGHTLY AS  NEEDED     lisinopril (ZESTRIL) 5 MG tablet Take 5 mg by mouth daily.     metFORMIN (GLUCOPHAGE) 500 MG tablet Take 500 mg by mouth 2 (two) times daily.     OZEMPIC, 0.25 OR 0.5 MG/DOSE, 2 MG/1.5ML SOPN SMARTSIG:0.25 Milligram(s) SUB-Q Once a Week     sildenafil (VIAGRA) 100 MG tablet Take 100 mg by mouth daily.     No current facility-administered medications for this encounter.    REVIEW OF SYSTEMS:  On review of systems, the patient reports that he is doing well overall. He denies any chest pain, shortness of breath, cough, fevers, chills, night sweats, unintended weight changes. He denies any bowel disturbances, and denies abdominal pain, nausea or vomiting. He denies any new musculoskeletal or joint aches or pains. His IPSS was 6, indicating mild urinary symptoms. His SHIM was 17, indicating he has mild erectile dysfunction. A complete review of systems is obtained and is otherwise negative.    PHYSICAL EXAM:  Wt Readings from Last 3 Encounters:  05/08/22 215 lb (97.5 kg)  09/03/15 237 lb (107.5 kg)   Temp Readings from Last 3 Encounters:  05/24/22 98 F (36.7 C) (Oral)  09/03/15 97.9 F (36.6 C) (Oral)   BP Readings from Last 3 Encounters:  06/05/22 (!) 151/82  05/24/22 (!) 135/93  05/08/22 117/73   Pulse Readings from Last 3 Encounters:  06/05/22 (!) 116  05/24/22 74  05/08/22 (!) 111    /10  In general this is a well appearing African American male in no acute distress. He's alert and oriented x4 and appropriate throughout the examination. Cardiopulmonary assessment is negative for acute distress, and he exhibits normal effort.     KPS = 100  100 - Normal; no complaints; no evidence of disease. 90   - Able to carry on normal activity; minor signs or symptoms of disease. 80   - Normal activity with effort; some signs or symptoms of disease. 5   - Cares for self; unable to carry on normal activity or to do active work. 60   - Requires occasional assistance, but is  able to care for most of his personal needs. 50   - Requires considerable assistance and frequent medical care. 57   - Disabled; requires special care and assistance. 55   - Severely disabled; hospital admission is indicated although death not imminent. 47   - Very sick; hospital admission necessary; active supportive treatment necessary. 10   - Moribund; fatal processes progressing rapidly. 0     - Dead  Karnofsky DA, Abelmann [place], Craver LS and Burchenal JH [phone]) The use of the nitrogen mustards in the palliative treatment of carcinoma: with particular reference to bronchogenic carcinoma Cancer 1 [id number]  LABORATORY DATA:  No results found for: "WBC", "HGB", "HCT", "MCV", "PLT" Lab Results  Component Value Date   NA 139 06/05/2022  K 4.6 06/05/2022   CL 100 06/05/2022   CO2 22 06/05/2022   No results found for: "ALT", "AST", "GGT", "ALKPHOS", "BILITOT"   RADIOGRAPHY: NM Bone Scan Whole Body  Result Date: 06/20/2022 CLINICAL DATA:  New diagnosis high risk prostate cancer, staging EXAM: NUCLEAR MEDICINE WHOLE BODY BONE SCAN TECHNIQUE: Whole body anterior and posterior images were obtained approximately 3 hours after intravenous injection of radiopharmaceutical. RADIOPHARMACEUTICALS:  20.6 mCi Technetium-52mMDP IV COMPARISON:  None available Correlation: CT abdomen and pelvis 06/07/2022 FINDINGS: Uptake at shoulders, sternoclavicular joints, elbows, wrists, and knees, typically degenerative. No worrisome sites of abnormal tracer accumulation are seen to suggest osseous metastatic disease. Expected urinary tract and soft tissue distribution of tracer. Minimal skin contamination medial RIGHT knee. IMPRESSION: No scintigraphic evidence of osseous metastatic disease. Electronically Signed   By: MLavonia DanaM.D.   On: 06/20/2022 17:10   CT Abdomen Pelvis W Wo Contrast  Result Date: 06/08/2022 CLINICAL DATA:  New diagnosis high risk prostate cancer staging * Tracking Code: BO * EXAM: CT ABDOMEN  AND PELVIS WITHOUT AND WITH CONTRAST TECHNIQUE: Multidetector CT imaging of the abdomen and pelvis was performed following the standard protocol before and following the bolus administration of intravenous contrast. RADIATION DOSE REDUCTION: This exam was performed according to the departmental dose-optimization program which includes automated exposure control, adjustment of the mA and/or kV according to patient size and/or use of iterative reconstruction technique. CONTRAST:  1084mOMNIPAQUE IOHEXOL 300 MG/ML  SOLN COMPARISON:  None Available. FINDINGS: Lower chest: No acute abnormality. Hepatobiliary: Numerous hypoenhancing liver lesions, none of which are clearly circumscribed, fluid attenuation cysts. Index lesion in the posterior liver dome measures 1.2 x 1.0 cm (series 8, image 11). Index lesion of the inferior left lobe of the liver, hepatic segment IV B measures 1.1 x 1.1 cm (series 8, image 22). No gallstones, gallbladder wall thickening, or biliary dilatation. Pancreas: Unremarkable. No pancreatic ductal dilatation or surrounding inflammatory changes. Spleen: Normal in size without significant abnormality. Adrenals/Urinary Tract: Rim calcified, lobulated appearance of the right adrenal gland consistent with prior infection or hemorrhage (series 3, image 21). The left adrenal gland is normal. Kidneys are normal, without renal calculi, solid lesion, or hydronephrosis. Thickening of the urinary bladder wall. Stomach/Bowel: Stomach is within normal limits. Appendix appears normal. No evidence of bowel wall thickening, distention, or inflammatory changes. Vascular/Lymphatic: No significant vascular findings are present. Numerous prominent retroperitoneal and bilateral iliac lymph nodes, with overtly enlarged left iliac, pelvic sidewall, and left inguinal lymph nodes, largest left inguinal nodes measuring 3.5 x 1.9 cm (series 8, image 90). Reproductive: No mass or other significant abnormality. Other: No  abdominal wall hernia or abnormality. No ascites. Musculoskeletal: No acute or significant osseous findings. IMPRESSION: 1. Numerous prominent retroperitoneal and bilateral iliac lymph nodes, with overtly enlarged left iliac, pelvic sidewall, and left inguinal lymph nodes, largest left inguinal nodes measuring 3.5 x 1.9 cm. 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 2. Numerous small hypoenhancing liver lesions, none of which are clearly circumscribed, fluid attenuation cysts. Although hepatic metastatic disease is uncommon in the setting of prostate malignancy, these are highly suspicious for metastases given clear evidence of nodal metastatic disease. 3. No CT evidence of osseous metastatic disease. Please note that nuclear scintigraphic bone scan or PET-CT are more sensitive for osseous metastatic disease prior to treatment of prostate malignancy. 4. Thickening of the urinary bladder wall, likely due to chronic outlet obstruction. These results will be called to the ordering clinician or representative  by the Radiologist Assistant, and communication documented in the PACS or [employer]. Electronically Signed   By: Delanna Ahmadi M.D.   On: 06/08/2022 09:58   US Guided Needle Placement  Result Date: 05/24/2022 CLINICAL DATA:  Ultrasound was provided for use by the ordering physician.  No provider Interpretation or professional fees incurred.       IMPRESSION/PLAN: 1. 51 y.o. gentleman with what appears to be at least locally advanced, Stage IVA, cT1c N1, adenocarcinoma of the prostate with Gleason Score of 4+3, PSA of 14.6 and evidence of nodal metastases, possibly liver mets, on staging CT A/P. We discussed the patient's workup and outlined the nature of prostate cancer in this setting.  He has what appears to be at least locally advanced disease with evidence of pelvic and retroperitoneal nodal metastases and questionable liver lesions on recent staging imaging.  We discussed the need to  obtain a PSMA PET scan to confirm the extent of disease outside of the prostate prior to making a formal treatment recommendation.  Pending the PSMA PET scan confirms localized or locally advanced disease only, he is eligible for a variety of potential treatment options including prostatectomy or LT-ADT in combination with either 8 weeks of external radiation or 5 weeks of external radiation with an upfront brachytherapy boost.  We briefly discussed that in the setting of locally advanced prostate cancer, with surgery, there is a high likelihood that he would require postoperative radiotherapy as a multimodal approach to treatment.  We discussed the available radiation techniques, and focused on the details and logistics of delivery.  We discussed and outlined the risks, benefits, short and long-term effects associated with radiotherapy and compared and contrasted these with prostatectomy. We discussed the role of SpaceOAR gel in reducing the rectal toxicity associated with radiotherapy. We also detailed the role of LT-ADT in the treatment of locally advanced prostate cancer and outlined the associated side effects that could be expected with this therapy.  He may also benefit from therapy escalation with androgen receptor blockade. We also discussed that should the PSMA PET scan confirm more distant metastatic disease, our treatment recommendations could change to focus on more of a systemic therapy approach.  He appears to have a good understanding of his disease and our treatment recommendations which are currently of curative intent.  He was encouraged to ask questions that were answered to his stated satisfaction.  At the conclusion of our conversation, the patient is interested in proceeding with scheduling a PSMA PET scan, first available to further evaluate the extent of his disease for treatment planning purposes. We will share this discussion with Dr. Alyson Ingles and place the order in an attempt to get  this completed as quickly as possible and I will call him with the results as soon as available to further discuss treatment recommendations at that time. He will also keep his scheduled consult with Dr. Tresa Moore to discuss surgical options and/or available research studies that he may be eligible for pending PSMA PET results.  We enjoyed meeting with him today and look forward to continuing to participate in his care.  We personally spent 70 minutes in this encounter including chart review, reviewing radiological studies, meeting face-to-face with the patient, entering orders and completing documentation.    Nicholos Johns, PA-C    Tyler Pita, MD  [facility] Direct Dial: [phone]  Fax: [phone] [email or web address]  Skype  [email or web address]   This document serves as a record of services personally  performed by Tyler Pita, MD and Freeman Caldron, PA-C. It was created on their behalf by Wilburn Mylar, a trained medical scribe. The creation of this record is based on the scribe's personal observations and the provider's statements to them. This document has been checked and approved by the attending provider.

## 2022-06-26 NOTE — Progress Notes (Signed)
RN left message for patient to call back to assess navigation needs and review upcoming next steps with PSMA PET.

## 2022-06-27 NOTE — Progress Notes (Signed)
RN left voicemail for patient to call back regarding upcoming scan.

## 2022-06-29 NOTE — Progress Notes (Signed)
Connected with patient to ntroduced myself to as the prostate nurse navigator, and to confirm he is aware of upcoming PSMA PET scan.  No barriers to care identified at this time.  He understands that the scan will be followed by a telephone call with results and finalize treatment plan.  Contact information provided, will continue to follow to ensure navigation needs are met.

## 2022-06-30 ENCOUNTER — Encounter (HOSPITAL_COMMUNITY): Payer: Self-pay

## 2022-06-30 ENCOUNTER — Encounter (HOSPITAL_COMMUNITY): Admission: RE | Admit: 2022-06-30 | Payer: 59 | Source: Ambulatory Visit

## 2022-06-30 ENCOUNTER — Ambulatory Visit (HOSPITAL_COMMUNITY)
Admission: RE | Admit: 2022-06-30 | Discharge: 2022-06-30 | Disposition: A | Discharge status: Home / Self Care | Payer: 59 | Source: Ambulatory Visit | Attending: Urology | Admitting: Urology

## 2022-06-30 DIAGNOSIS — C61 Malignant neoplasm of prostate: Secondary | ICD-10-CM | POA: Diagnosis present

## 2022-06-30 MED ORDER — PIFLIFOLASTAT F 18 (PYLARIFY) INJECTION
9.0000 | Freq: Once | INTRAVENOUS | Status: AC
  Administered 2022-06-30: 8.25 via INTRAVENOUS

## 2022-07-03 ENCOUNTER — Telehealth: Payer: Self-pay

## 2022-07-03 NOTE — Telephone Encounter (Signed)
Voicemail reminder for patient's 07/03/22-3:00pm w/ Ashlyn Bruning PA-C. I left my extension [phone] and requested that patient return my call.

## 2022-07-04 ENCOUNTER — Telehealth: Payer: Self-pay | Admitting: Urology

## 2022-07-04 ENCOUNTER — Ambulatory Visit
Admission: RE | Admit: 2022-07-04 | Discharge: 2022-07-04 | Disposition: A | Discharge status: Home / Self Care | Payer: 59 | Source: Ambulatory Visit | Attending: Urology | Admitting: Urology

## 2022-07-04 ENCOUNTER — Encounter: Payer: Self-pay | Admitting: Urology

## 2022-07-04 ENCOUNTER — Other Ambulatory Visit: Payer: Self-pay

## 2022-07-04 DIAGNOSIS — Z809 Family history of malignant neoplasm, unspecified: Secondary | ICD-10-CM | POA: Insufficient documentation

## 2022-07-04 DIAGNOSIS — C61 Malignant neoplasm of prostate: Secondary | ICD-10-CM | POA: Diagnosis present

## 2022-07-04 DIAGNOSIS — E119 Type 2 diabetes mellitus without complications: Secondary | ICD-10-CM | POA: Insufficient documentation

## 2022-07-04 DIAGNOSIS — I10 Essential (primary) hypertension: Secondary | ICD-10-CM | POA: Diagnosis not present

## 2022-07-04 NOTE — Progress Notes (Signed)
Radiation Oncology         [phone] ________________________________  Outpatient Follow up Visit  Name: Andres Carter MRN: [id number]  Date: 07/04/2022  DOB: 01/09/1971  [id number], Andres Deis, MD  Andres Carter, Andres Furbish, MD   REFERRING PHYSICIAN: Cleon Gustin, MD  DIAGNOSIS: 51 y.o. gentleman with T1c, Gleason 4+3  adenocarcinoma of the prostate with a PSA of 14.6.    ICD-10-CM   1. Malignant neoplasm of prostate ([place])  C61       HISTORY OF PRESENT ILLNESS: Andres Carter is a 51 y.o. male with a diagnosis of prostate cancer. He was noted to have an elevated PSA of 14.6 by his primary care physician, Dr. Ileana Carter.  Accordingly, he was referred for evaluation in urology by Dr. Alyson Carter on 05/08/22,  digital rectal examination was performed at that time revealing no nodules.  The patient proceeded to transrectal ultrasound with 12 biopsies of the prostate on 05/24/22.  The prostate volume measured 25.3 cc.  Out of 12 core biopsies, all 12 were positive.  The maximum Gleason score was 4+3, and this was seen in all 6 left-sided cores (with 5 showing perineural invasion). Additionally, Gleason 3+4 was seen in all 6 right-sided cores (with 3 showing PNI).   He underwent staging CT A/P on 06/07/22 showing numerous prominent retroperitoneal and bilateral iliac lymph nodes, with overtly enlarged left iliac, pelvic sidewall, and left inguinal lymph nodes consistent with nodal metastases. Additionally, there were numerous small hypoenhancing liver lesions, highly suspicious for metastases but no evidence of osseous metastatic disease. Staging bone scan on 06/20/22 was also negative for osseous metastatic disease.   We initially met with him on 06/21/22 and recommended further evaluation of the CT findings prior to making a formal treatment recommendation. He had a PSMA PET scan on 06/30/22 which confirmed focal activity in the prostate, consistent with his known prostate adenocarcinoma but no  convincing evidence of nodal metastasis and no visceral or skeletal metastases noted.  We reviewed these results today and further discussed his treatment options.  He had a scheduled consult visit with Dr. Tresa Carter earlier today to discuss surgical treatment options but unfortunately missed this visit because he was not aware of the time/date, as it does not show in myChart.  He is interested in rescheduling the consult visit.  PREVIOUS RADIATION THERAPY: No  PAST MEDICAL HISTORY:  Past Medical History:  Diagnosis Date   Cancer ([place])    Prostate   Diabetes mellitus without complication ([place])    Hypertension       PAST SURGICAL HISTORY:History reviewed. No pertinent surgical history.  FAMILY HISTORY:  Family History  Problem Relation Age of Onset   Diabetes Mother    Cancer Father    Diabetes Father     SOCIAL HISTORY:  Social History   Socioeconomic History   Marital status: Single    Spouse name: Not on file   Number of children: Not on file   Years of education: Not on file   Highest education level: Not on file  Occupational History   Not on file  Tobacco Use   Smoking status: Never   Smokeless tobacco: Not on file  Vaping Use   Vaping Use: Never used  Substance and Sexual Activity   Alcohol use: No    Alcohol/week: 0.0 standard drinks of alcohol   Drug use: No   Sexual activity: Not on file  Other Topics Concern   Not on file  Social History Narrative  Not on file   Social Determinants of Health   Financial Resource Strain: Not on file  Food Insecurity: Not on file  Transportation Needs: Not on file  Physical Activity: Not on file  Stress: Not on file  Social Connections: Not on file  Intimate Partner Violence: Not on file    ALLERGIES: Patient has no known allergies.  MEDICATIONS:  Current Outpatient Medications  Medication Sig Dispense Refill   albuterol (VENTOLIN HFA) 108 (90 Base) MCG/ACT inhaler SMARTSIG:2 Puff(s) By Mouth Every 4 Hours PRN      atorvastatin (LIPITOR) 10 MG tablet Take 10 mg by mouth at bedtime.     Continuous Blood Gluc Sensor (FREESTYLE LIBRE 14 DAY SENSOR) MISC SMARTSIG:1 Each Topical Every 2 Weeks     Cyanocobalamin (B-12) 500 MCG SUBL Place 1 tablet under the tongue daily.     diclofenac (VOLTAREN) 75 MG EC tablet Take 1 tablet (75 mg total) by mouth 2 (two) times daily. 60 tablet 2   gabapentin (NEURONTIN) 100 MG capsule TAKE 1 CAPSULE BY MOUTH NIGHTLY AS NEEDED     lisinopril (ZESTRIL) 5 MG tablet Take 5 mg by mouth daily.     metFORMIN (GLUCOPHAGE) 500 MG tablet Take 500 mg by mouth 2 (two) times daily.     OZEMPIC, 2 MG/DOSE, 8 MG/3ML SOPN Inject into the skin.     sildenafil (VIAGRA) 100 MG tablet Take 100 mg by mouth daily.     No current facility-administered medications for this encounter.    REVIEW OF SYSTEMS:  On review of systems, the patient reports that he is doing well overall. He denies any chest pain, shortness of breath, cough, fevers, chills, night sweats, unintended weight changes. He denies any bowel disturbances, and denies abdominal pain, nausea or vomiting. He denies any new musculoskeletal or joint aches or pains. His IPSS was 6, indicating mild urinary symptoms. His SHIM was 17, indicating he has mild erectile dysfunction. A complete review of systems is obtained and is otherwise negative.    PHYSICAL EXAM:  Wt Readings from Last 3 Encounters:  06/21/22 213 lb 3.2 oz (96.7 kg)  05/08/22 215 lb (97.5 kg)  09/03/15 237 lb (107.5 kg)   Temp Readings from Last 3 Encounters:  06/21/22 (!) 97.2 F (36.2 C)  05/24/22 98 F (36.7 C) (Oral)  09/03/15 97.9 F (36.6 C) (Oral)   BP Readings from Last 3 Encounters:  06/21/22 138/84  06/05/22 (!) 151/82  05/24/22 (!) 135/93   Pulse Readings from Last 3 Encounters:  06/21/22 69  06/05/22 (!) 116  05/24/22 74   Pain Assessment Pain Score: 0-No pain/10  In general this is a well appearing African American male in no acute distress.  He's alert and oriented x4 and appropriate throughout the examination. Cardiopulmonary assessment is negative for acute distress, and he exhibits normal effort.     KPS = 100  100 - Normal; no complaints; no evidence of disease. 90   - Able to carry on normal activity; minor signs or symptoms of disease. 80   - Normal activity with effort; some signs or symptoms of disease. 22   - Cares for self; unable to carry on normal activity or to do active work. 60   - Requires occasional assistance, but is able to care for most of his personal needs. 50   - Requires considerable assistance and frequent medical care. 28   - Disabled; requires special care and assistance. 30   - Severely disabled; hospital admission  is indicated although death not imminent. 72   - Very sick; hospital admission necessary; active supportive treatment necessary. 10   - Moribund; fatal processes progressing rapidly. 0     - Dead  Karnofsky DA, Abelmann Brewster, Craver LS and Burchenal JH [phone]) The use of the nitrogen mustards in the palliative treatment of carcinoma: with particular reference to bronchogenic carcinoma Cancer 1 [id number]  LABORATORY DATA:  No results found for: "WBC", "HGB", "HCT", "MCV", "PLT" Lab Results  Component Value Date   NA 139 06/05/2022   K 4.6 06/05/2022   CL 100 06/05/2022   CO2 22 06/05/2022   No results found for: "ALT", "AST", "GGT", "ALKPHOS", "BILITOT"   RADIOGRAPHY: NM PET (PSMA) SKULL TO MID THIGH  Result Date: 07/03/2022 CLINICAL DATA:  Gleason 4+3=7 prostate carcinoma. Concern for nodal metastasis and questionable liver lesion. EXAM: NUCLEAR MEDICINE PET SKULL BASE TO THIGH TECHNIQUE: 8.3 mCi F18 Piflufolastat (Pylarify) was injected intravenously. Full-ring PET imaging was performed from the skull base to thigh after the radiotracer. CT data was obtained and used for attenuation correction and anatomic localization. COMPARISON:  None Available. FINDINGS: NECK No radiotracer activity  in neck lymph nodes. Incidental CT finding: None. CHEST No radiotracer accumulation within mediastinal or hilar lymph nodes. No suspicious pulmonary nodules on the CT scan. Incidental CT finding: None. ABDOMEN/PELVIS Prostate: Single focus intense radiotracer activity in the posterior aspect of the LEFT mid gland SUV max equal 4.4. prostate cancer much more extensive on biopsy and potentially could be obscured by high activity in the bladder. Lymph nodes: Unfortunately there is halo of photopenia surrounded intense activity within the bladder. This halo of photopenia could mask activity within a iliac lymph node. There is no clear activity associated with the mildly enlarged LEFT operator node measuring 9 mm (186/4). There is mild radiotracer activity associated with enlarged LEFT inguinal node measuring 19 mm short axis with SUV max equal 3.7. Mild radiotracer activity associated with a distal LEFT periaortic lymph node measuring 7 mm short axis with SUV max equal 2.0. potential nodal tissue higher LEFT aorta measuring 13 mm image 141 a does not have radiotracer activity. Lymph node along the RIGHT common iliac artery measuring 7 mm (image 177/4) does not have clear radiotracer activity. This appears above the bladder artifact. Liver: No abnormal radiotracer in liver to suggest hepatic metastasis. Incidental CT finding: None. SKELETON No focal activity to suggest skeletal metastasis. IMPRESSION: 1. Focal activity in the posterior LEFT mid gland would be consistent with prostate adenocarcinoma. Prostate cancer within LEFT and RIGHT lobe on biopsy therefore greater extent of involvement potentially obscured by activity from the bladder. 2. No convincing evidence of nodal metastasis. There is artifact from high activity urine within the pelvis which could obscure iliac node. 3. Very mild activity associated periaortic small nodes is favored benign. 4. Mild activity associated with LEFT inguinal nodes is indeterminate  but favored benign. These nodes could easily be sampled. 5. No evidence of liver metastasis. 6. No evidence skeletal metastasis Electronically Signed   By: Suzy Bouchard M.D.   On: 07/03/2022 14:22   NM Bone Scan Whole Body  Result Date: 06/20/2022 CLINICAL DATA:  New diagnosis high risk prostate cancer, staging EXAM: NUCLEAR MEDICINE WHOLE BODY BONE SCAN TECHNIQUE: Whole body anterior and posterior images were obtained approximately 3 hours after intravenous injection of radiopharmaceutical. RADIOPHARMACEUTICALS:  20.6 mCi Technetium-80mMDP IV COMPARISON:  None available Correlation: CT abdomen and pelvis 06/07/2022 FINDINGS: Uptake at shoulders, sternoclavicular joints, elbows,  wrists, and knees, typically degenerative. No worrisome sites of abnormal tracer accumulation are seen to suggest osseous metastatic disease. Expected urinary tract and soft tissue distribution of tracer. Minimal skin contamination medial RIGHT knee. IMPRESSION: No scintigraphic evidence of osseous metastatic disease. Electronically Signed   By: Lavonia Dana M.D.   On: 06/20/2022 17:10   CT Abdomen Pelvis W Wo Contrast  Result Date: 06/08/2022 CLINICAL DATA:  New diagnosis high risk prostate cancer staging * Tracking Code: BO * EXAM: CT ABDOMEN AND PELVIS WITHOUT AND WITH CONTRAST TECHNIQUE: Multidetector CT imaging of the abdomen and pelvis was performed following the standard protocol before and following the bolus administration of intravenous contrast. RADIATION DOSE REDUCTION: This exam was performed according to the departmental dose-optimization program which includes automated exposure control, adjustment of the mA and/or kV according to patient size and/or use of iterative reconstruction technique. CONTRAST:  111m OMNIPAQUE IOHEXOL 300 MG/ML  SOLN COMPARISON:  None Available. FINDINGS: Lower chest: No acute abnormality. Hepatobiliary: Numerous hypoenhancing liver lesions, none of which are clearly circumscribed, fluid  attenuation cysts. Index lesion in the posterior liver dome measures 1.2 x 1.0 cm (series 8, image 11). Index lesion of the inferior left lobe of the liver, hepatic segment IV B measures 1.1 x 1.1 cm (series 8, image 22). No gallstones, gallbladder wall thickening, or biliary dilatation. Pancreas: Unremarkable. No pancreatic ductal dilatation or surrounding inflammatory changes. Spleen: Normal in size without significant abnormality. Adrenals/Urinary Tract: Rim calcified, lobulated appearance of the right adrenal gland consistent with prior infection or hemorrhage (series 3, image 21). The left adrenal gland is normal. Kidneys are normal, without renal calculi, solid lesion, or hydronephrosis. Thickening of the urinary bladder wall. Stomach/Bowel: Stomach is within normal limits. Appendix appears normal. No evidence of bowel wall thickening, distention, or inflammatory changes. Vascular/Lymphatic: No significant vascular findings are present. Numerous prominent retroperitoneal and bilateral iliac lymph nodes, with overtly enlarged left iliac, pelvic sidewall, and left inguinal lymph nodes, largest left inguinal nodes measuring 3.5 x 1.9 cm (series 8, image 90). Reproductive: No mass or other significant abnormality. Other: No abdominal wall hernia or abnormality. No ascites. Musculoskeletal: No acute or significant osseous findings. IMPRESSION: 1. Numerous prominent retroperitoneal and bilateral iliac lymph nodes, with overtly enlarged left iliac, pelvic sidewall, and left inguinal lymph nodes, largest left inguinal nodes measuring 3.5 x 1.9 cm. 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 2. Numerous small hypoenhancing liver lesions, none of which are clearly circumscribed, fluid attenuation cysts. Although hepatic metastatic disease is uncommon in the setting of prostate malignancy, these are highly suspicious for metastases given clear evidence of nodal metastatic disease. 3. No CT evidence of osseous  metastatic disease. Please note that nuclear scintigraphic bone scan or PET-CT are more sensitive for osseous metastatic disease prior to treatment of prostate malignancy. 4. Thickening of the urinary bladder wall, likely due to chronic outlet obstruction. These results will be called to the ordering clinician or representative by the Radiologist Assistant, and communication documented in the PACS or [employer] Electronically Signed   By: ADelanna AhmadiM.D.   On: 06/08/2022 09:58      IMPRESSION/PLAN: 1. 51y.o. gentleman with T1c, Gleason 4+3  adenocarcinoma of the prostate with a PSA of 14.6.  We discussed the patient's workup and outlined the nature of prostate cancer in this setting.  Fortunately, his recent PSMA PET scan confirms localized disease only.  Therefore, he is eligible for a variety of potential treatment options including prostatectomy or LT-ADT  in combination with either 8 weeks of external radiation or 5 weeks of external radiation with an upfront brachytherapy boost.  We reviewed the available radiation techniques, and focused on the details and logistics of delivery.  We discussed and outlined the risks, benefits, short and long-term effects associated with radiotherapy and compared and contrasted these with prostatectomy. We discussed the role of SpaceOAR gel in reducing the rectal toxicity associated with radiotherapy. We also detailed the role of LT-ADT in the treatment of high volume, unfavorable intermediate risk and outlined the associated side effects that could be expected with this therapy. He appears to have a good understanding of his disease and our treatment recommendations which are of curative intent.  He was encouraged to ask questions that were answered to his stated satisfaction.  Today, the patient focused most of his questions and interest in robotic-assisted laparoscopic radical prostatectomy.  We discussed some of the potential advantages of surgery including  surgical staging, the availability of salvage radiotherapy to the prostatic fossa, and the confidence associated with immediate biochemical response.  We discussed some of the potential proven indications for postoperative radiotherapy including positive margins, extracapsular extension, and seminal vesicle involvement. We also talked about some of the other potential findings leading to a recommendation for radiotherapy including a non-zero postoperative PSA and positive lymph nodes.   At the conclusion of our conversation, the patient is most interested in proceeding with prostatectomy and would like to get his consult visit with Dr. Tresa Carter rescheduled as soon as possible as he is quite anxious to proceed with treatment.  We enjoyed meeting with him again today, and will look forward to following his progress via correspondence.  Of course, if he were to ultimately decide to proceed with definitive radiation, or should there be any clinical indication for radiation postoperatively, we would be more than happy to continue to participate in his care.  He has a scheduled follow-up visit with Dr. Alyson Carter tomorrow and we will begin working to get him rescheduled for a consult with Dr. Tresa Carter to discuss robotic assisted laparoscopic prostatectomy.  I personally spent 25 minutes in this encounter including chart review, reviewing radiological studies, meeting face-to-face with the patient, entering orders and completing documentation.    Nicholos Johns, PA-C    Andres Pita, MD  [facility] Direct Dial: [phone]  Fax: [phone] [email or web address]  Skype  [email or web address]

## 2022-07-04 NOTE — Progress Notes (Signed)
Follow-up appointment. I verified patient's identity and began nursing interview. No prostate related discomfort reported at this time.  Meaningful use complete. I-PSS score of 1- mild. No urinary management medications at this time. Urology appt- Jul 05, 2022.  There were no vitals taken for this visit.

## 2022-07-04 NOTE — Telephone Encounter (Signed)
Ref to [facility] to Dr. [name]  C61 (ICD-10-CM) - Prostate cancer [facility])  "Rejection Reason - Patient was No Show" Walker Shadow said on Jul 04, 2022 3:55 PM

## 2022-07-04 NOTE — Telephone Encounter (Signed)
FYI

## 2022-07-05 ENCOUNTER — Ambulatory Visit (INDEPENDENT_AMBULATORY_CARE_PROVIDER_SITE_OTHER): Payer: 59 | Admitting: Urology

## 2022-07-05 ENCOUNTER — Encounter: Payer: Self-pay | Admitting: Urology

## 2022-07-05 VITALS — BP 145/76 | HR 80

## 2022-07-05 DIAGNOSIS — C61 Malignant neoplasm of prostate: Secondary | ICD-10-CM | POA: Diagnosis not present

## 2022-07-05 NOTE — Patient Instructions (Signed)

## 2022-07-05 NOTE — Progress Notes (Signed)
07/05/2022 11:58 AM   Andres Carter [birth year] [id number]  Referring provider: Claretta Fraise, MD [place],  [place] [address]  Followup prostate cancer  HPI: Andres Carter is a 51yo here for followup for prostate cancer. He met with Dr. Tammi Klippel but has not met with Dr. Tresa Moore. He is most interested in prostatectomy. CT abd/pelvis showed possible metastatic disease but PSMA PET was negative for metastatic disease   PMH: Past Medical History:  Diagnosis Date   Cancer ([place])    Prostate   Diabetes mellitus without complication ([place])    Hypertension     Surgical History: No past surgical history on file.  Home Medications:  Allergies as of 07/05/2022   No Known Allergies      Medication List        Accurate as of July 05, 2022 11:58 AM. If you have any questions, ask your nurse or doctor.          albuterol 108 (90 Base) MCG/ACT inhaler Commonly known as: VENTOLIN HFA SMARTSIG:2 Puff(s) By Mouth Every 4 Hours PRN   atorvastatin 10 MG tablet Commonly known as: LIPITOR Take 10 mg by mouth at bedtime.   B-12 500 MCG Subl Place 1 tablet under the tongue daily.   diclofenac 75 MG EC tablet Commonly known as: VOLTAREN Take 1 tablet (75 mg total) by mouth 2 (two) times daily.   FreeStyle Libre 14 Day Sensor Misc SMARTSIG:1 Each Topical Every 2 Weeks   gabapentin 100 MG capsule Commonly known as: NEURONTIN TAKE 1 CAPSULE BY MOUTH NIGHTLY AS NEEDED   lisinopril 5 MG tablet Commonly known as: ZESTRIL Take 5 mg by mouth daily.   metFORMIN 500 MG tablet Commonly known as: GLUCOPHAGE Take 500 mg by mouth 2 (two) times daily.   Ozempic (2 MG/DOSE) 8 MG/3ML Sopn Generic drug: Semaglutide (2 MG/DOSE) Inject into the skin.   sildenafil 100 MG tablet Commonly known as: VIAGRA Take 100 mg by mouth daily.        Allergies: No Known Allergies  Family History: Family History  Problem Relation Age of Onset   Diabetes Mother    Cancer  Father    Diabetes Father     Social History:  reports that he has never smoked. He does not have any smokeless tobacco history on file. He reports that he does not drink alcohol and does not use drugs.  ROS: All other review of systems were reviewed and are negative except what is noted above in HPI  Physical Exam: BP (!) 145/76   Pulse 80   Constitutional:  Alert and oriented, No acute distress. HEENT: [place] AT, moist mucus membranes.  Trachea midline, no masses. Cardiovascular: No clubbing, cyanosis, or edema. Respiratory: Normal respiratory effort, no increased work of breathing. GI: Abdomen is soft, nontender, nondistended, no abdominal masses GU: No CVA tenderness.  Lymph: No cervical or inguinal lymphadenopathy. Skin: No rashes, bruises or suspicious lesions. Neurologic: Grossly intact, no focal deficits, moving all 4 extremities. Psychiatric: Normal mood and affect.  Laboratory Data: No results found for: "WBC", "HGB", "HCT", "MCV", "PLT"  Lab Results  Component Value Date   CREATININE 1.40 (H) 06/07/2022    No results found for: "PSA"  No results found for: "TESTOSTERONE"  No results found for: "HGBA1C"  Urinalysis    Component Value Date/Time   APPEARANCEUR Clear 05/08/2022 1629   GLUCOSEU Negative 05/08/2022 1629   BILIRUBINUR Negative 05/08/2022 1629   PROTEINUR 1+ (A) 05/08/2022 1629  NITRITE Negative 05/08/2022 1629   LEUKOCYTESUR Negative 05/08/2022 1629    Lab Results  Component Value Date   LABMICR See below: 05/08/2022   WBCUA None seen 05/08/2022   LABEPIT 0-10 05/08/2022   MUCUS Present 05/08/2022   BACTERIA None seen 05/08/2022    Pertinent Imaging: PSMA PET: Images reviewed and discussed with the patient  No results found for this or any previous visit.  No results found for this or any previous visit.  No results found for this or any previous visit.  No results found for this or any previous visit.  No results found for this or  any previous visit.  No results found for this or any previous visit.  No results found for this or any previous visit.  No results found for this or any previous visit.   Assessment & Plan:    1. Prostate cancer [facility]) -Referral to Dr. Tresa Moore for consideration off RALP. - Urinalysis, Routine w reflex microscopic   No follow-ups on file.  Nicolette Bang, MD  [facility] [place]

## 2022-07-06 LAB — MICROSCOPIC EXAMINATION
Bacteria, UA: NONE SEEN
Renal Epithel, UA: NONE SEEN /hpf
WBC, UA: NONE SEEN /hpf (ref 0–5)

## 2022-07-06 LAB — URINALYSIS, ROUTINE W REFLEX MICROSCOPIC
Bilirubin, UA: NEGATIVE
Ketones, UA: NEGATIVE
Leukocytes,UA: NEGATIVE
Nitrite, UA: NEGATIVE
Specific Gravity, UA: 1.02 (ref 1.005–1.030)
Urobilinogen, Ur: 1 mg/dL (ref 0.2–1.0)
pH, UA: 7 (ref 5.0–7.5)

## 2022-07-06 NOTE — Progress Notes (Signed)
Pt with surgical consult with Dr. Tresa Moore on 07/24/2022.   RN will follow up after consult to finalize treatment decision.

## 2022-07-25 NOTE — Progress Notes (Signed)
Patient had RadOnc consult on 8/9 and surgical consult on 9/11 for his locally advanced, Stage IVA, cT1c N1, adenocarcinoma of the prostate with Gleason Score of 4+3, PSA of 14.6.   Patient has decided to proceed with prostatectomy with Dr. Tresa Moore, and will be scheduled and contacted by [facility].   No further needs at this time.

## 2022-07-26 ENCOUNTER — Other Ambulatory Visit: Payer: Self-pay | Admitting: Urology

## 2022-07-28 ENCOUNTER — Encounter: Payer: Self-pay | Admitting: Urology

## 2022-07-28 ENCOUNTER — Ambulatory Visit (INDEPENDENT_AMBULATORY_CARE_PROVIDER_SITE_OTHER): Payer: 59 | Admitting: Urology

## 2022-07-28 VITALS — BP 126/81 | HR 76

## 2022-07-28 DIAGNOSIS — C61 Malignant neoplasm of prostate: Secondary | ICD-10-CM

## 2022-07-28 NOTE — Patient Instructions (Signed)

## 2022-07-28 NOTE — Progress Notes (Unsigned)
07/28/2022 12:30 PM   Andres Carter 1971-08-22 [id number]  Referring provider: Wilburt Finlay, MD [address] [place],  [place] [address]  Followup prostate cancer   HPI: Andres Carter is a 51yo here for followup for prostate cancer. Prostatectomy scheduled for 10/4 with Dr. Tresa Moore. No significant LUTS. No other complaints .    PMH: Past Medical History:  Diagnosis Date   Cancer [facility])    Prostate   Diabetes mellitus without complication ([place])    Hypertension     Surgical History: No past surgical history on file.  Home Medications:  Allergies as of 07/28/2022   No Known Allergies      Medication List        Accurate as of July 28, 2022 12:30 PM. If you have any questions, ask your nurse or doctor.          albuterol 108 (90 Base) MCG/ACT inhaler Commonly known as: VENTOLIN HFA SMARTSIG:2 Puff(s) By Mouth Every 4 Hours PRN   atorvastatin 10 MG tablet Commonly known as: LIPITOR Take 10 mg by mouth at bedtime.   B-12 500 MCG Subl Place 1 tablet under the tongue daily.   diclofenac 75 MG EC tablet Commonly known as: VOLTAREN Take 1 tablet (75 mg total) by mouth 2 (two) times daily.   FreeStyle Libre 14 Day Sensor Misc SMARTSIG:1 Each Topical Every 2 Weeks   gabapentin 100 MG capsule Commonly known as: NEURONTIN TAKE 1 CAPSULE BY MOUTH NIGHTLY AS NEEDED   lisinopril 5 MG tablet Commonly known as: ZESTRIL Take 5 mg by mouth daily.   metFORMIN 500 MG tablet Commonly known as: GLUCOPHAGE Take 500 mg by mouth 2 (two) times daily.   metFORMIN 500 MG 24 hr tablet Commonly known as: GLUCOPHAGE-XR Take 500 mg by mouth daily.   Ozempic (2 MG/DOSE) 8 MG/3ML Sopn Generic drug: Semaglutide (2 MG/DOSE) Inject into the skin.   sildenafil 100 MG tablet Commonly known as: VIAGRA Take 100 mg by mouth daily.        Allergies: No Known Allergies  Family History: Family History  Problem Relation Age of Onset   Diabetes Mother    Cancer Father     Diabetes Father     Social History:  reports that he has never smoked. He does not have any smokeless tobacco history on file. He reports that he does not drink alcohol and does not use drugs.  ROS: All other review of systems were reviewed and are negative except what is noted above in HPI  Physical Exam: BP 126/81   Pulse 76   Constitutional:  Alert and oriented, No acute distress. HEENT: [place] AT, moist mucus membranes.  Trachea midline, no masses. Cardiovascular: No clubbing, cyanosis, or edema. Respiratory: Normal respiratory effort, no increased work of breathing. GI: Abdomen is soft, nontender, nondistended, no abdominal masses GU: No CVA tenderness.  Lymph: No cervical or inguinal lymphadenopathy. Skin: No rashes, bruises or suspicious lesions. Neurologic: Grossly intact, no focal deficits, moving all 4 extremities. Psychiatric: Normal mood and affect.  Laboratory Data: No results found for: "WBC", "HGB", "HCT", "MCV", "PLT"  Lab Results  Component Value Date   CREATININE 1.40 (H) 06/07/2022    No results found for: "PSA"  No results found for: "TESTOSTERONE"  No results found for: "HGBA1C"  Urinalysis    Component Value Date/Time   APPEARANCEUR Clear 07/05/2022 1208   GLUCOSEU Trace (A) 07/05/2022 1208   BILIRUBINUR Negative 07/05/2022 1208   PROTEINUR 1+ (A) 07/05/2022 1208  NITRITE Negative 07/05/2022 1208   LEUKOCYTESUR Negative 07/05/2022 1208    Lab Results  Component Value Date   LABMICR See below: 07/05/2022   WBCUA None seen 07/05/2022   LABEPIT 0-10 07/05/2022   MUCUS Present 07/05/2022   BACTERIA None seen 07/05/2022    Pertinent Imaging:  No results found for this or any previous visit.  No results found for this or any previous visit.  No results found for this or any previous visit.  No results found for this or any previous visit.  No results found for this or any previous visit.  No results found for this or any previous  visit.  No results found for this or any previous visit.  No results found for this or any previous visit.   Assessment & Plan:    1. Prostate cancer [facility]) -Patient to proceed with Robotic prostatectomy with Dr. Tresa Moore   No follow-ups on file.  Nicolette Bang, MD  [facility] [place]

## 2022-08-04 NOTE — Patient Instructions (Signed)
SURGICAL WAITING ROOM VISITATION Patients having surgery or a procedure may have no more than 2 support people in the waiting area - these visitors may rotate in the visitor waiting room.   Children under the age of 78 must have an adult with them who is not the patient. If the patient needs to stay at the hospital during part of their recovery, the visitor guidelines for inpatient rooms apply.  PRE-OP VISITATION  Pre-op nurse will coordinate an appropriate time for 1 support person to accompany the patient in pre-op.  This support person may not rotate.  This visitor will be contacted when the time is appropriate for the visitor to come back in the pre-op area.  Please refer to the [facility] website for the visitor guidelines for Inpatients (after your surgery is over and you are in a regular room).  You are not required to quarantine at this time prior to your surgery. However, you must do this: Hand Hygiene often Do NOT share personal items Notify your provider if you are in close contact with someone who has COVID or you develop fever 100.4 or greater, new onset of sneezing, cough, sore throat, shortness of breath or body aches.   If you received a COVID test during your pre-op visit  it is requested that you wear a mask when out in public, stay away from anyone that may not be feeling well and notify your surgeon if you develop symptoms. If you test positive for Covid or have been in contact with anyone that has tested positive in the last 10 days please notify you surgeon.       Your procedure is scheduled on:  Wednesday  August 16, 2022  Report to [facility] Main Entrance.  Report to admitting at: 12:45  PM  +++++Call this number if you have any questions or problems the morning of surgery [phone]  Do not eat food :After Midnight the night prior to your surgery/procedure.  After Midnight you may have the following liquids until   12:00  PM DAY OF SURGERY  Clear  Liquid Diet Water Black Coffee (sugar ok, NO MILK/CREAM OR CREAMERS)  Tea (sugar ok, NO MILK/CREAM OR CREAMERS) regular and decaf                             Plain Jell-O (NO RED)                                           Fruit ices (not with fruit pulp, NO RED)                                     Popsicles (NO RED)                                                                  Juice: apple, WHITE grape, WHITE cranberry Sports drinks like Gatorade (NO RED)                 FOLLOW BOWEL PREP AND  ANY ADDITIONAL PRE OP INSTRUCTIONS YOU RECEIVED FROM YOUR SURGEON'S OFFICE!!!      Magnesium citrate-  Drink 1 bottle by noon the day before surgery.    Oral Hygiene is also important to reduce your risk of infection.        Remember - BRUSH YOUR TEETH THE MORNING OF SURGERY WITH YOUR REGULAR TOOTHPASTE  Do NOT smoke after Midnight the night before surgery.  Diabetic Medications:    Call [facility] [phone] to get a replacement for your [facility] monitor.   Ozempic-  don't injection within 5 days of surgery. Resume on Tuesday the week after surgery Metformin - Take your usual dose the DAY BEFORE surgery. Don't take Metformin the morning of your surgery.   Take ONLY these medicines the morning of surgery with A SIP OF WATER: none,   If needed you may use your Albuterol Inhaler.                     You may not have any metal on your body including jewelry, and body piercing  Do not wear lotions, powders, cologne, or deodorant  Men may shave face and neck.  Contacts, Hearing Aids, dentures or bridgework may not be worn into surgery.   You may bring a small overnight bag with you on the day of surgery, only pack items that are not valuable .[facility] IS NOT RESPONSIBLE   FOR VALUABLES THAT ARE LOST OR STOLEN.   DO NOT [place]. PHARMACY WILL DISPENSE MEDICATIONS LISTED ON YOUR MEDICATION LIST TO YOU DURING YOUR ADMISSION [place]!    Please read over the following fact sheets you were given: IF YOU HAVE QUESTIONS ABOUT YOUR PRE-OP INSTRUCTIONS, PLEASE CALL [phone]  ([place])   [place] - Preparing for Surgery Before surgery, you can play an important role.  Because skin is not sterile, your skin needs to be as free of germs as possible.  You can reduce the number of germs on your skin by washing with CHG (chlorahexidine gluconate) soap before surgery.  CHG is an antiseptic cleaner which kills germs and bonds with the skin to continue killing germs even after washing. Please DO NOT use if you have an allergy to CHG or antibacterial soaps.  If your skin becomes reddened/irritated stop using the CHG and inform your nurse when you arrive at Short Stay. Do not shave (including legs and underarms) for at least 48 hours prior to the first CHG shower.  You may shave your face/neck.  Please follow these instructions carefully:  1.  Shower with CHG Soap the night before surgery and the  morning of surgery.  2.  If you choose to wash your hair, wash your hair first as usual with your normal  shampoo.  3.  After you shampoo, rinse your hair and body thoroughly to remove the shampoo.                             4.  Use CHG as you would any other liquid soap.  You can apply chg directly to the skin and wash.  Gently with a scrungie or clean washcloth.  5.  Apply the CHG Soap to your body ONLY FROM THE NECK DOWN.   Do not use on face/ open  Wound or open sores. Avoid contact with eyes, ears mouth and genitals (private parts).                       Wash face,  Genitals (private parts) with your normal soap.             6.  Wash thoroughly, paying special attention to the area where your  surgery  will be performed.  7.  Thoroughly rinse your body with warm water from the neck down.  8.  DO NOT shower/wash with your normal soap after using and rinsing off the CHG Soap.            9.  Pat yourself  dry with a clean towel.            10.  Wear clean pajamas.            11.  Place clean sheets on your bed the night of your first shower and do not  sleep with pets.  ON THE DAY OF SURGERY : Do not apply any lotions/deodorants the morning of surgery.  Please wear clean clothes to the hospital/surgery center.    FAILURE TO FOLLOW THESE INSTRUCTIONS MAY RESULT IN THE CANCELLATION OF YOUR SURGERY  PATIENT SIGNATURE_________________________________  NURSE SIGNATURE__________________________________  ________________________________________________________________________

## 2022-08-04 NOTE — Progress Notes (Signed)
COVID Vaccine received:  '[]'$  No '[x]'$  Yes Date of any COVID positive Test in last 90 days: none  PCP -  Dr. B[facility] - none  Chest x-ray -  EKG -  n/a Stress Test - n/a ECHO - n/a Cardiac Cath - n/a  Pacemaker/ICD device     '[x]'$  N/A Spinal Cord Stimulator:'[x]'$  No '[]'$  Yes      (Remind patient to bring remote DOS) Other Implants: none  Bowel Prep - Magnesium Citrate, Drink 1 bottle by 1200 noon the day prior to surgery.   History of Sleep Apnea? '[x]'$  No '[]'$  Yes   Sleep Study Date:   CPAP used?- '[x]'$  No '[]'$  Yes  (Instruct to bring their mask & Tubing)  Does the patient monitor blood sugar? '[]'$  No '[x]'$  Yes  '[]'$  N/A Does patient have a [employer] or Dexacom? '[]'$  No '[x]'$  Yes   Fasting Blood Sugar Ranges-  Checks Blood Sugar __continuous times a day  Blood Thinner Instructions: None Aspirin Instructions:  None Last Dose:  ERAS Protocol Ordered: '[x]'$  No  '[]'$  Yes PRE-SURGERY '[]'$  ENSURE  '[]'$  G2   Comments: Gave patient the information on getting replacement Freestyle Libre monitor from [employer].   Activity level: Patient can climb a flight of stairs without difficulty; '[x]'$  No CP  '[x]'$  No SOB  Anesthesia review:  DM, HTN  Patient denies shortness of breath, fever, cough and chest pain at PAT appointment.  Patient verbalized understanding and agreement to the Pre-Surgical Instructions that were given to them at this PAT appointment. Patient was also educated of the need to review these PAT instructions again prior to his/her surgery.I reviewed the appropriate phone numbers to call if they have any and questions or concerns.

## 2022-08-07 ENCOUNTER — Other Ambulatory Visit: Payer: Self-pay

## 2022-08-07 ENCOUNTER — Encounter (HOSPITAL_COMMUNITY)
Admission: RE | Admit: 2022-08-07 | Discharge: 2022-08-07 | Disposition: A | Discharge status: Home / Self Care | Payer: 59 | Source: Ambulatory Visit | Attending: Urology | Admitting: Urology

## 2022-08-07 ENCOUNTER — Encounter (HOSPITAL_COMMUNITY): Payer: Self-pay

## 2022-08-07 ENCOUNTER — Ambulatory Visit: Payer: 59 | Admitting: Urology

## 2022-08-07 VITALS — BP 126/68 | HR 70 | Temp 98.6°F | Resp 16 | Ht 66.5 in | Wt 213.0 lb

## 2022-08-07 DIAGNOSIS — I10 Essential (primary) hypertension: Secondary | ICD-10-CM | POA: Diagnosis not present

## 2022-08-07 DIAGNOSIS — Z01818 Encounter for other preprocedural examination: Secondary | ICD-10-CM | POA: Insufficient documentation

## 2022-08-07 DIAGNOSIS — R7303 Prediabetes: Secondary | ICD-10-CM | POA: Insufficient documentation

## 2022-08-07 HISTORY — DX: Unspecified osteoarthritis, unspecified site: M19.90

## 2022-08-07 LAB — BASIC METABOLIC PANEL
Anion gap: 8 (ref 5–15)
BUN: 12 mg/dL (ref 6–20)
CO2: 25 mmol/L (ref 22–32)
Calcium: 9.3 mg/dL (ref 8.9–10.3)
Chloride: 103 mmol/L (ref 98–111)
Creatinine, Ser: 1.25 mg/dL — ABNORMAL HIGH (ref 0.61–1.24)
GFR, Estimated: >60 mL/min (ref >60)
Glucose, Bld: 148 mg/dL — ABNORMAL HIGH (ref 70–99)
Potassium: 3.8 mmol/L (ref 3.5–5.1)
Sodium: 136 mmol/L (ref 135–145)

## 2022-08-07 LAB — GLUCOSE, CAPILLARY
Glucose-Capillary: 141 mg/dL — ABNORMAL HIGH (ref 70–99)
Glucose-Capillary: 148 mg/dL — ABNORMAL HIGH (ref 70–99)

## 2022-08-07 LAB — CBC
HCT: 41.6 % (ref 39.0–52.0)
Hemoglobin: 13.4 g/dL (ref 13.0–17.0)
MCH: 28.8 pg (ref 26.0–34.0)
MCHC: 32.2 g/dL (ref 30.0–36.0)
MCV: 89.3 fL (ref 80.0–100.0)
Platelets: 365 10*3/uL (ref 150–400)
RBC: 4.66 MIL/uL (ref 4.22–5.81)
RDW: 12.3 % (ref 11.5–15.5)
WBC: 5.8 10*3/uL (ref 4.0–10.5)
nRBC: 0 % (ref 0.0–0.2)

## 2022-08-07 LAB — HEMOGLOBIN A1C
Hgb A1c MFr Bld: 7 % — ABNORMAL HIGH (ref 4.8–5.6)
Mean Plasma Glucose: 154.2 mg/dL

## 2022-08-16 ENCOUNTER — Other Ambulatory Visit: Payer: Self-pay

## 2022-08-16 ENCOUNTER — Observation Stay (HOSPITAL_COMMUNITY)
Admission: RE | Admit: 2022-08-16 | Discharge: 2022-08-18 | Disposition: A | Discharge status: Home / Self Care | Payer: 59 | Attending: Urology | Admitting: Urology

## 2022-08-16 ENCOUNTER — Ambulatory Visit (HOSPITAL_COMMUNITY): Payer: 59 | Admitting: Certified Registered"

## 2022-08-16 ENCOUNTER — Encounter (HOSPITAL_COMMUNITY): Payer: Self-pay | Admitting: Urology

## 2022-08-16 ENCOUNTER — Ambulatory Visit (HOSPITAL_BASED_OUTPATIENT_CLINIC_OR_DEPARTMENT_OTHER): Payer: 59 | Admitting: Certified Registered"

## 2022-08-16 ENCOUNTER — Encounter (HOSPITAL_COMMUNITY)
Admission: RE | Disposition: A | Discharge status: Home / Self Care | Payer: Self-pay | Source: Home / Self Care | Attending: Urology

## 2022-08-16 DIAGNOSIS — C61 Malignant neoplasm of prostate: Secondary | ICD-10-CM

## 2022-08-16 DIAGNOSIS — E119 Type 2 diabetes mellitus without complications: Secondary | ICD-10-CM | POA: Insufficient documentation

## 2022-08-16 DIAGNOSIS — I10 Essential (primary) hypertension: Secondary | ICD-10-CM | POA: Diagnosis not present

## 2022-08-16 DIAGNOSIS — R7303 Prediabetes: Secondary | ICD-10-CM

## 2022-08-16 HISTORY — PX: ROBOT ASSISTED LAPAROSCOPIC RADICAL PROSTATECTOMY: SHX5141

## 2022-08-16 HISTORY — PX: LYMPH NODE DISSECTION: SHX5087

## 2022-08-16 LAB — GLUCOSE, CAPILLARY
Glucose-Capillary: 134 mg/dL — ABNORMAL HIGH (ref 70–99)
Glucose-Capillary: 156 mg/dL — ABNORMAL HIGH (ref 70–99)
Glucose-Capillary: 207 mg/dL — ABNORMAL HIGH (ref 70–99)

## 2022-08-16 LAB — HEMOGLOBIN AND HEMATOCRIT, BLOOD
HCT: 42.7 % (ref 39.0–52.0)
Hemoglobin: 13.5 g/dL (ref 13.0–17.0)

## 2022-08-16 SURGERY — PROSTATECTOMY, RADICAL, ROBOT-ASSISTED, LAPAROSCOPIC
Anesthesia: General

## 2022-08-16 MED ORDER — INSULIN ASPART 100 UNIT/ML IJ SOLN
0.0000 [IU] | Freq: Three times a day (TID) | INTRAMUSCULAR | Status: DC
  Administered 2022-08-17 (×3): 3 [IU] via SUBCUTANEOUS
  Administered 2022-08-18: 2 [IU] via SUBCUTANEOUS

## 2022-08-16 MED ORDER — OXYCODONE HCL 5 MG/5ML PO SOLN: 5.0000 mg | Freq: Once | ORAL | Status: DC | PRN

## 2022-08-16 MED ORDER — ONDANSETRON HCL 4 MG/2ML IJ SOLN
INTRAMUSCULAR | Status: AC
  Filled 2022-08-16: qty 2

## 2022-08-16 MED ORDER — FENTANYL CITRATE (PF) 100 MCG/2ML IJ SOLN
INTRAMUSCULAR | Status: AC
  Filled 2022-08-16: qty 2

## 2022-08-16 MED ORDER — ALBUTEROL SULFATE (2.5 MG/3ML) 0.083% IN NEBU: 2.5000 mg | INHALATION_SOLUTION | Freq: Four times a day (QID) | RESPIRATORY_TRACT | Status: DC | PRN

## 2022-08-16 MED ORDER — LACTATED RINGERS IV SOLN: INTRAVENOUS | Status: DC | PRN

## 2022-08-16 MED ORDER — FENTANYL CITRATE PF 50 MCG/ML IJ SOSY
PREFILLED_SYRINGE | INTRAMUSCULAR | Status: AC
  Administered 2022-08-16: 50 ug via INTRAVENOUS
  Filled 2022-08-16: qty 1

## 2022-08-16 MED ORDER — HYOSCYAMINE SULFATE 0.125 MG SL SUBL
0.1250 mg | SUBLINGUAL_TABLET | SUBLINGUAL | Status: DC | PRN
  Filled 2022-08-16: qty 1

## 2022-08-16 MED ORDER — BUPIVACAINE LIPOSOME 1.3 % IJ SUSP
INTRAMUSCULAR | Status: DC | PRN
  Administered 2022-08-16: 20 mL

## 2022-08-16 MED ORDER — FENTANYL CITRATE PF 50 MCG/ML IJ SOSY
PREFILLED_SYRINGE | INTRAMUSCULAR | Status: AC
  Filled 2022-08-16: qty 1

## 2022-08-16 MED ORDER — ORAL CARE MOUTH RINSE: 15.0000 mL | Freq: Once | OROMUCOSAL | Status: AC

## 2022-08-16 MED ORDER — CHLORHEXIDINE GLUCONATE 0.12 % MT SOLN
15.0000 mL | Freq: Once | OROMUCOSAL | Status: AC
  Administered 2022-08-16: 15 mL via OROMUCOSAL

## 2022-08-16 MED ORDER — DEXAMETHASONE SODIUM PHOSPHATE 10 MG/ML IJ SOLN
INTRAMUSCULAR | Status: DC | PRN
  Administered 2022-08-16: 4 mg via INTRAVENOUS

## 2022-08-16 MED ORDER — BUPIVACAINE LIPOSOME 1.3 % IJ SUSP
INTRAMUSCULAR | Status: AC
  Filled 2022-08-16: qty 20

## 2022-08-16 MED ORDER — ONDANSETRON HCL 4 MG/2ML IJ SOLN
INTRAMUSCULAR | Status: DC | PRN
  Administered 2022-08-16: 4 mg via INTRAVENOUS

## 2022-08-16 MED ORDER — LACTATED RINGERS IV SOLN: INTRAVENOUS | Status: DC

## 2022-08-16 MED ORDER — ROCURONIUM BROMIDE 10 MG/ML (PF) SYRINGE
PREFILLED_SYRINGE | INTRAVENOUS | Status: DC | PRN
  Administered 2022-08-16: 20 mg via INTRAVENOUS
  Administered 2022-08-16: 60 mg via INTRAVENOUS

## 2022-08-16 MED ORDER — MAGNESIUM CITRATE PO SOLN: 1.0000 | Freq: Once | ORAL | Status: DC

## 2022-08-16 MED ORDER — LIDOCAINE HCL 1 % IJ SOLN
INTRAMUSCULAR | Status: AC
  Filled 2022-08-16: qty 20

## 2022-08-16 MED ORDER — FENTANYL CITRATE PF 50 MCG/ML IJ SOSY
PREFILLED_SYRINGE | INTRAMUSCULAR | Status: AC
  Administered 2022-08-16: 25 ug via INTRAVENOUS
  Filled 2022-08-16: qty 1

## 2022-08-16 MED ORDER — SODIUM CHLORIDE (PF) 0.9 % IJ SOLN
INTRAMUSCULAR | Status: AC
  Filled 2022-08-16: qty 20

## 2022-08-16 MED ORDER — DEXAMETHASONE SODIUM PHOSPHATE 10 MG/ML IJ SOLN
INTRAMUSCULAR | Status: AC
  Filled 2022-08-16: qty 1

## 2022-08-16 MED ORDER — FENTANYL CITRATE PF 50 MCG/ML IJ SOSY
25.0000 ug | PREFILLED_SYRINGE | INTRAMUSCULAR | Status: DC | PRN
  Administered 2022-08-16: 25 ug via INTRAVENOUS
  Administered 2022-08-16: 50 ug via INTRAVENOUS

## 2022-08-16 MED ORDER — SODIUM CHLORIDE 0.45 % IV SOLN: INTRAVENOUS | Status: DC

## 2022-08-16 MED ORDER — PHENYLEPHRINE HCL-NACL 20-0.9 MG/250ML-% IV SOLN
INTRAVENOUS | Status: DC | PRN
  Administered 2022-08-16: 20 ug/min via INTRAVENOUS

## 2022-08-16 MED ORDER — ATORVASTATIN CALCIUM 10 MG PO TABS
10.0000 mg | ORAL_TABLET | Freq: Every day | ORAL | Status: DC
  Administered 2022-08-16 – 2022-08-17 (×2): 10 mg via ORAL
  Filled 2022-08-16 (×2): qty 1

## 2022-08-16 MED ORDER — OXYCODONE HCL 5 MG PO TABS: 5.0000 mg | ORAL_TABLET | Freq: Once | ORAL | Status: DC | PRN

## 2022-08-16 MED ORDER — ACETAMINOPHEN 500 MG PO TABS
1000.0000 mg | ORAL_TABLET | Freq: Four times a day (QID) | ORAL | Status: AC
  Administered 2022-08-16 – 2022-08-17 (×3): 1000 mg via ORAL
  Filled 2022-08-16 (×3): qty 2

## 2022-08-16 MED ORDER — MIDAZOLAM HCL 2 MG/2ML IJ SOLN
INTRAMUSCULAR | Status: AC
  Filled 2022-08-16: qty 2

## 2022-08-16 MED ORDER — LACTATED RINGERS IR SOLN
Status: DC | PRN
  Administered 2022-08-16: 1000 mL

## 2022-08-16 MED ORDER — PROPOFOL 10 MG/ML IV BOLUS
INTRAVENOUS | Status: DC | PRN
  Administered 2022-08-16: 200 mg via INTRAVENOUS

## 2022-08-16 MED ORDER — DIPHENHYDRAMINE HCL 50 MG/ML IJ SOLN: 12.5000 mg | Freq: Four times a day (QID) | INTRAMUSCULAR | Status: DC | PRN

## 2022-08-16 MED ORDER — SULFAMETHOXAZOLE-TRIMETHOPRIM 800-160 MG PO TABS: 1.0000 | ORAL_TABLET | Freq: Two times a day (BID) | ORAL | 0 refills | Status: DC

## 2022-08-16 MED ORDER — PROPOFOL 10 MG/ML IV BOLUS
INTRAVENOUS | Status: AC
  Filled 2022-08-16: qty 20

## 2022-08-16 MED ORDER — SODIUM CHLORIDE (PF) 0.9 % IJ SOLN
INTRAMUSCULAR | Status: DC | PRN
  Administered 2022-08-16: 10 mL

## 2022-08-16 MED ORDER — DIPHENHYDRAMINE HCL 12.5 MG/5ML PO ELIX: 12.5000 mg | ORAL_SOLUTION | Freq: Four times a day (QID) | ORAL | Status: DC | PRN

## 2022-08-16 MED ORDER — HYDROMORPHONE HCL 1 MG/ML IJ SOLN
0.5000 mg | INTRAMUSCULAR | Status: DC | PRN
  Administered 2022-08-16 – 2022-08-17 (×2): 1 mg via INTRAVENOUS
  Filled 2022-08-16 (×3): qty 1

## 2022-08-16 MED ORDER — LISINOPRIL 5 MG PO TABS
5.0000 mg | ORAL_TABLET | Freq: Every day | ORAL | Status: DC
  Administered 2022-08-16 – 2022-08-18 (×3): 5 mg via ORAL
  Filled 2022-08-16 (×3): qty 1

## 2022-08-16 MED ORDER — CEFAZOLIN SODIUM-DEXTROSE 2-4 GM/100ML-% IV SOLN
2.0000 g | INTRAVENOUS | Status: AC
  Administered 2022-08-16: 2 g via INTRAVENOUS
  Filled 2022-08-16: qty 100

## 2022-08-16 MED ORDER — FENTANYL CITRATE (PF) 250 MCG/5ML IJ SOLN
INTRAMUSCULAR | Status: DC | PRN
  Administered 2022-08-16: 100 ug via INTRAVENOUS
  Administered 2022-08-16 (×4): 50 ug via INTRAVENOUS

## 2022-08-16 MED ORDER — MIDAZOLAM HCL 2 MG/2ML IJ SOLN
INTRAMUSCULAR | Status: DC | PRN
  Administered 2022-08-16: 2 mg via INTRAVENOUS

## 2022-08-16 MED ORDER — ONDANSETRON HCL 4 MG/2ML IJ SOLN: 4.0000 mg | Freq: Four times a day (QID) | INTRAMUSCULAR | Status: DC | PRN

## 2022-08-16 MED ORDER — LIDOCAINE 2% (20 MG/ML) 5 ML SYRINGE
INTRAMUSCULAR | Status: DC | PRN
  Administered 2022-08-16: 40 mg via INTRAVENOUS

## 2022-08-16 MED ORDER — ROCURONIUM BROMIDE 10 MG/ML (PF) SYRINGE
PREFILLED_SYRINGE | INTRAVENOUS | Status: AC
  Filled 2022-08-16: qty 10

## 2022-08-16 MED ORDER — SODIUM CHLORIDE 0.9 % IV BOLUS
1000.0000 mL | Freq: Once | INTRAVENOUS | Status: AC
  Administered 2022-08-16: 1000 mL via INTRAVENOUS

## 2022-08-16 MED ORDER — DOCUSATE SODIUM 100 MG PO CAPS
100.0000 mg | ORAL_CAPSULE | Freq: Two times a day (BID) | ORAL | Status: DC
  Administered 2022-08-16 – 2022-08-18 (×4): 100 mg via ORAL
  Filled 2022-08-16 (×4): qty 1

## 2022-08-16 MED ORDER — ONDANSETRON HCL 4 MG/2ML IJ SOLN: 4.0000 mg | INTRAMUSCULAR | Status: DC | PRN

## 2022-08-16 MED ORDER — HYDROCODONE-ACETAMINOPHEN 5-325 MG PO TABS: 1.0000 | ORAL_TABLET | Freq: Four times a day (QID) | ORAL | 0 refills | Status: DC | PRN

## 2022-08-16 MED ORDER — OXYCODONE HCL 5 MG PO TABS
5.0000 mg | ORAL_TABLET | ORAL | Status: DC | PRN
  Administered 2022-08-16: 5 mg via ORAL
  Filled 2022-08-16: qty 1

## 2022-08-16 MED ORDER — DOCUSATE SODIUM 100 MG PO CAPS: 100.0000 mg | ORAL_CAPSULE | Freq: Two times a day (BID) | ORAL | Status: DC

## 2022-08-16 MED ORDER — ALBUTEROL SULFATE HFA 108 (90 BASE) MCG/ACT IN AERS: 2.0000 | INHALATION_SPRAY | Freq: Four times a day (QID) | RESPIRATORY_TRACT | Status: DC | PRN

## 2022-08-16 MED ORDER — TRIPLE ANTIBIOTIC 3.5-400-5000 EX OINT: 1.0000 | TOPICAL_OINTMENT | Freq: Three times a day (TID) | CUTANEOUS | Status: DC | PRN

## 2022-08-16 MED ORDER — SUGAMMADEX SODIUM 200 MG/2ML IV SOLN
INTRAVENOUS | Status: DC | PRN
  Administered 2022-08-16: 400 mg via INTRAVENOUS

## 2022-08-16 SURGICAL SUPPLY — 73 items
ADH SKN CLS APL DERMABOND .7 (GAUZE/BANDAGES/DRESSINGS) ×2
APL PRP STRL LF DISP 70% ISPRP (MISCELLANEOUS) ×2
APL SWBSTK 6 STRL LF DISP (MISCELLANEOUS) ×2
APPLICATOR COTTON TIP 6 STRL (MISCELLANEOUS) ×3 IMPLANT
APPLICATOR COTTON TIP 6IN STRL (MISCELLANEOUS) ×2
BAG COUNTER SPONGE SURGICOUNT (BAG) IMPLANT
BAG SPNG CNTER NS LX DISP (BAG)
CATH FOLEY 2WAY SLVR 18FR 30CC (CATHETERS) ×3 IMPLANT
CATH TIEMANN FOLEY 18FR 5CC (CATHETERS) ×3 IMPLANT
CHLORAPREP W/TINT 26 (MISCELLANEOUS) ×3 IMPLANT
CLIP LIGATING HEM O LOK PURPLE (MISCELLANEOUS) ×12 IMPLANT
CNTNR URN SCR LID CUP LEK RST (MISCELLANEOUS) ×3 IMPLANT
CONT SPEC 4OZ STRL OR WHT (MISCELLANEOUS) ×2
COVER SURGICAL LIGHT HANDLE (MISCELLANEOUS) ×3 IMPLANT
COVER TIP SHEARS 8 DVNC (MISCELLANEOUS) ×3 IMPLANT
COVER TIP SHEARS 8MM DA VINCI (MISCELLANEOUS) ×2
CUTTER ECHEON FLEX ENDO 45 340 (ENDOMECHANICALS) ×3 IMPLANT
DERMABOND ADVANCED .7 DNX12 (GAUZE/BANDAGES/DRESSINGS) ×3 IMPLANT
DRAIN CHANNEL RND F F (WOUND CARE) IMPLANT
DRAPE ARM DVNC X/XI (DISPOSABLE) ×12 IMPLANT
DRAPE COLUMN DVNC XI (DISPOSABLE) ×3 IMPLANT
DRAPE DA VINCI XI ARM (DISPOSABLE) ×8
DRAPE DA VINCI XI COLUMN (DISPOSABLE) ×2
DRAPE SURG IRRIG POUCH 19X23 (DRAPES) ×3 IMPLANT
DRSG TEGADERM 4X4.75 (GAUZE/BANDAGES/DRESSINGS) ×3 IMPLANT
ELECT PENCIL ROCKER SW 15FT (MISCELLANEOUS) IMPLANT
ELECT REM PT RETURN 15FT ADLT (MISCELLANEOUS) ×3 IMPLANT
GAUZE 4X4 16PLY ~~LOC~~+RFID DBL (SPONGE) IMPLANT
GAUZE SPONGE 2X2 8PLY STRL LF (GAUZE/BANDAGES/DRESSINGS) IMPLANT
GAUZE SPONGE 4X4 12PLY STRL (GAUZE/BANDAGES/DRESSINGS) ×3 IMPLANT
GLOVE BIO SURGEON STRL SZ 6.5 (GLOVE) ×3 IMPLANT
GLOVE BIOGEL PI IND STRL 7.5 (GLOVE) ×3 IMPLANT
GLOVE SURG LX STRL 7.5 STRW (GLOVE) ×6 IMPLANT
GOWN SRG XL LVL 4 BRTHBL STRL (GOWNS) ×3 IMPLANT
GOWN STRL NON-REIN XL LVL4 (GOWNS) ×2
GOWN STRL REUS W/ TWL XL LVL3 (GOWN DISPOSABLE) ×6 IMPLANT
GOWN STRL REUS W/TWL XL LVL3 (GOWN DISPOSABLE) ×4
HOLDER FOLEY CATH W/STRAP (MISCELLANEOUS) ×3 IMPLANT
IRRIG SUCT STRYKERFLOW 2 WTIP (MISCELLANEOUS) ×2
IRRIGATION SUCT STRKRFLW 2 WTP (MISCELLANEOUS) ×3 IMPLANT
IV LACTATED RINGERS 1000ML (IV SOLUTION) ×3 IMPLANT
KIT PROCEDURE DA VINCI SI (MISCELLANEOUS) ×2
KIT PROCEDURE DVNC SI (MISCELLANEOUS) ×3 IMPLANT
KIT TURNOVER KIT A (KITS) IMPLANT
NDL INSUFFLATION 14GA 120MM (NEEDLE) ×3 IMPLANT
NDL SPNL 22GX7 QUINCKE BK (NEEDLE) ×3 IMPLANT
NEEDLE INSUFFLATION 14GA 120MM (NEEDLE) ×2 IMPLANT
NEEDLE SPNL 22GX7 QUINCKE BK (NEEDLE) ×2 IMPLANT
PACK ROBOT UROLOGY CUSTOM (CUSTOM PROCEDURE TRAY) ×3 IMPLANT
PAD POSITIONING PINK XL (MISCELLANEOUS) ×3 IMPLANT
PORT ACCESS TROCAR AIRSEAL 12 (TROCAR) ×3 IMPLANT
PORT ACCESS TROCAR AIRSEAL 5M (TROCAR) ×2
RELOAD STAPLE 45 4.1 GRN THCK (STAPLE) ×3 IMPLANT
SEAL CANN UNIV 5-8 DVNC XI (MISCELLANEOUS) ×12 IMPLANT
SEAL XI 5MM-8MM UNIVERSAL (MISCELLANEOUS) ×8
SET TRI-LUMEN FLTR TB AIRSEAL (TUBING) ×3 IMPLANT
SOLUTION ELECTROLUBE (MISCELLANEOUS) ×3 IMPLANT
SPIKE FLUID TRANSFER (MISCELLANEOUS) ×3 IMPLANT
SPONGE T-LAP 4X18 ~~LOC~~+RFID (SPONGE) ×3 IMPLANT
STAPLE RELOAD 45 GRN (STAPLE) ×4 IMPLANT
STAPLE RELOAD 45MM GREEN (STAPLE) ×4
SUT ETHILON 3 0 PS 1 (SUTURE) ×3 IMPLANT
SUT MNCRL AB 4-0 PS2 18 (SUTURE) ×6 IMPLANT
SUT PDS AB 1 CT1 27 (SUTURE) ×6 IMPLANT
SUT VIC AB 2-0 SH 27 (SUTURE) ×2
SUT VIC AB 2-0 SH 27X BRD (SUTURE) ×3 IMPLANT
SUT VICRYL 0 UR6 27IN ABS (SUTURE) ×3 IMPLANT
SUT VLOC BARB 180 ABS3/0GR12 (SUTURE) ×6
SUTURE VLOC BRB 180 ABS3/0GR12 (SUTURE) ×9 IMPLANT
SYR 27GX1/2 1ML LL SAFETY (SYRINGE) ×3 IMPLANT
TOWEL OR NON WOVEN STRL DISP B (DISPOSABLE) ×3 IMPLANT
TROCAR Z-THREAD FIOS 5X100MM (TROCAR) IMPLANT
WATER STERILE IRR 1000ML POUR (IV SOLUTION) ×3 IMPLANT

## 2022-08-16 NOTE — H&P (Signed)
Andres Carter is an 51 y.o. male.    Chief Complaint: Pre-OP Prostatectomy  HPI:   1 -Large Volume Moderate Risk Prostate Cancer in Young Man - 12/12 core up to 70% grade 3 cancer by BX 2023 by [place] on eval PSA 14. CT with some diffuse non-bulky adenopathy (inguinal, iliac, para-aortic) but PET non-Avid. TRUS 25 mL, No median.   2 - Diffuse Adenopathy - incidetnal diffuse adenopathy (most impressive left inguinal), all non-bulky on prostate cancer staging 2023 despite PSA that is not extremely elevated (would be expected much higher if prostate cancer mets).   PMH sig for obesity, DM2 (A1c 7s). NO CV disease / blood thinners. No chest/abd surgery. Works in [employer] for [employer]). 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 His PCP is with [facility].   Today " Andres Carter" is seen to proceed with prostatetomy / node dissection for primary management of large volume but clinically localized prostate cancer.   Past Medical History:  Diagnosis Date   Arthritis    gout   Cancer ([place])    Prostate   Diabetes mellitus without complication ([place])    Hypertension     Past Surgical History:  Procedure Laterality Date   NO PAST SURGERIES      Family History  Problem Relation Age of Onset   Diabetes Mother    Cancer Father    Diabetes Father    Social History:  reports that he has never smoked. He does not have any smokeless tobacco history on file. He reports that he does not drink alcohol and does not use drugs.  Allergies: No Known Allergies  No medications prior to admission.    No results found for this or any previous visit (from the past 48 hour(s)). No results found.  Review of Systems  Constitutional:  Negative for chills and fever.  All other systems reviewed and are negative.   There were no vitals taken for this visit. Physical Exam Vitals reviewed.  HENT:     Head: Normocephalic.  Eyes:     Pupils: Pupils are  equal, round, and reactive to light.  Cardiovascular:     Rate and Rhythm: Normal rate.  Pulmonary:     Effort: Pulmonary effort is normal.  Abdominal:     General: Abdomen is flat.  Musculoskeletal:        General: Normal range of motion.  Neurological:     General: No focal deficit present.     Mental Status: He is alert.  Psychiatric:        Mood and Affect: Mood normal.      Assessment/Plan  Proceed as planned with prostatectomy / node dissection. RIsks,benefits, alternatives, expected peri-op course discussed previously and reiterated today.   [name], MD 08/16/2022, 7:53 AM

## 2022-08-16 NOTE — Brief Op Note (Signed)
08/16/2022  5:07 PM  PATIENT:  Andres Carter  51 y.o. male  PRE-OPERATIVE DIAGNOSIS:  PROSTATE CANCER  POST-OPERATIVE DIAGNOSIS:  PROSTATE CANCER  PROCEDURE:  Procedure(s): XI ROBOTIC ASSISTED LAPAROSCOPIC RADICAL PROSTATECTOMY WITH INDOCYANINE GREEN DYE (N/A) LYMPH NODE DISSECTION (Bilateral)  SURGEON:  Surgeon(s) and Role:    * [name], MD - Primary  PHYSICIAN ASSISTANT:   ASSISTANTS: Debbrah Alar PA   ANESTHESIA:   general  EBL:  71m   BLOOD ADMINISTERED:none  DRAINS:  1 - foley to gravity; 2 - JP to bulb    LOCAL MEDICATIONS USED:  MARCAINE     SPECIMEN:  Source of Specimen:  1 - pelvic lymph nodes; 2 - prostatectomy  DISPOSITION OF SPECIMEN:  PATHOLOGY  COUNTS:  YES  TOURNIQUET:  * No tourniquets in log *  DICTATION: .Other Dictation: Dictation Number [id number] PLAN OF CARE: Admit for overnight observation  PATIENT DISPOSITION:  PACU - hemodynamically stable.   Delay start of Pharmacological VTE agent (>24hrs) due to surgical blood loss or risk of bleeding: yes

## 2022-08-16 NOTE — Transfer of Care (Signed)
Immediate Anesthesia Transfer of Care Note  Patient: Andres Carter  Procedure(s) Performed: XI ROBOTIC ASSISTED LAPAROSCOPIC RADICAL PROSTATECTOMY WITH INDOCYANINE GREEN DYE LYMPH NODE DISSECTION (Bilateral)  Patient Location: PACU  Anesthesia Type:General  Level of Consciousness: drowsy and patient cooperative  Airway & Oxygen Therapy: Patient Spontanous Breathing and Patient connected to face mask oxygen  Post-op Assessment: Report given to RN and Post -op Vital signs reviewed and stable  Post vital signs: Reviewed and stable  Last Vitals:  Vitals Value Taken Time  BP 149/89 08/16/22 1722  Temp 36.6 C 08/16/22 1722  Pulse 97 08/16/22 1726  Resp 15 08/16/22 1726  SpO2 98 % 08/16/22 1726  Vitals shown include unvalidated device data.  Last Pain:  Vitals:   08/16/22 1242  TempSrc: Oral  PainSc: 0-No pain         Complications: No notable events documented.

## 2022-08-16 NOTE — Anesthesia Preprocedure Evaluation (Signed)
Anesthesia Evaluation  Patient identified by MRN, date of birth, ID band Patient awake    Reviewed: Allergy & Precautions, H&P , NPO status , Patient's Chart, lab work & pertinent test results  Airway Mallampati: II   Neck ROM: full    Dental   Pulmonary neg pulmonary ROS,    breath sounds clear to auscultation       Cardiovascular hypertension,  Rhythm:regular Rate:Normal     Neuro/Psych    GI/Hepatic   Endo/Other  diabetes, Type 2  Renal/GU    Prostate CA    Musculoskeletal  (+) Arthritis ,   Abdominal   Peds  Hematology   Anesthesia Other Findings   Reproductive/Obstetrics                             Anesthesia Physical Anesthesia Plan  ASA: 2  Anesthesia Plan: General   Post-op Pain Management:    Induction: Intravenous  PONV Risk Score and Plan: 2 and Ondansetron, Dexamethasone, Midazolam and Treatment may vary due to age or medical condition  Airway Management Planned: Oral ETT  Additional Equipment:   Intra-op Plan:   Post-operative Plan: Extubation in OR  Informed Consent: I have reviewed the patients History and Physical, chart, labs and discussed the procedure including the risks, benefits and alternatives for the proposed anesthesia with the patient or authorized representative who has indicated his/her understanding and acceptance.     Dental advisory given  Plan Discussed with: CRNA, Anesthesiologist and Surgeon  Anesthesia Plan Comments:         Anesthesia Quick Evaluation

## 2022-08-16 NOTE — Discharge Instructions (Addendum)

## 2022-08-16 NOTE — Anesthesia Procedure Notes (Signed)
Procedure Name: Intubation Date/Time: 08/16/2022 2:58 PM  Performed by: Montel Clock, CRNAPre-anesthesia Checklist: Patient identified, Emergency Drugs available, Suction available, Patient being monitored and Timeout performed Patient Re-evaluated:Patient Re-evaluated prior to induction Oxygen Delivery Method: Circle system utilized Preoxygenation: Pre-oxygenation with 100% oxygen Induction Type: IV induction Ventilation: Mask ventilation without difficulty and Oral airway inserted - appropriate to patient size Laryngoscope Size: Mac and 4 Grade View: Grade II Tube type: Oral Tube size: 7.5 mm Number of attempts: 1 Airway Equipment and Method: Stylet Placement Confirmation: ETT inserted through vocal cords under direct vision, positive ETCO2 and breath sounds checked- equal and bilateral Secured at: 23 cm Tube secured with: Tape Dental Injury: Teeth and Oropharynx as per pre-operative assessment

## 2022-08-17 ENCOUNTER — Encounter (HOSPITAL_COMMUNITY): Payer: Self-pay | Admitting: Urology

## 2022-08-17 DIAGNOSIS — C61 Malignant neoplasm of prostate: Secondary | ICD-10-CM | POA: Diagnosis not present

## 2022-08-17 LAB — HEMOGLOBIN AND HEMATOCRIT, BLOOD
HCT: 34.3 % — ABNORMAL LOW (ref 39.0–52.0)
Hemoglobin: 10.9 g/dL — ABNORMAL LOW (ref 13.0–17.0)

## 2022-08-17 LAB — BASIC METABOLIC PANEL
Anion gap: 6 (ref 5–15)
BUN: 15 mg/dL (ref 6–20)
CO2: 25 mmol/L (ref 22–32)
Calcium: 8.4 mg/dL — ABNORMAL LOW (ref 8.9–10.3)
Chloride: 104 mmol/L (ref 98–111)
Creatinine, Ser: 1.16 mg/dL (ref 0.61–1.24)
GFR, Estimated: >60 mL/min (ref >60)
Glucose, Bld: 186 mg/dL — ABNORMAL HIGH (ref 70–99)
Potassium: 4.4 mmol/L (ref 3.5–5.1)
Sodium: 135 mmol/L (ref 135–145)

## 2022-08-17 LAB — GLUCOSE, CAPILLARY
Glucose-Capillary: 182 mg/dL — ABNORMAL HIGH (ref 70–99)
Glucose-Capillary: 153 mg/dL — ABNORMAL HIGH (ref 70–99)
Glucose-Capillary: 194 mg/dL — ABNORMAL HIGH (ref 70–99)
Glucose-Capillary: 141 mg/dL — ABNORMAL HIGH (ref 70–99)

## 2022-08-17 MED ORDER — KETOROLAC TROMETHAMINE 15 MG/ML IJ SOLN
15.0000 mg | Freq: Three times a day (TID) | INTRAMUSCULAR | Status: DC
  Administered 2022-08-17 – 2022-08-18 (×3): 15 mg via INTRAVENOUS
  Filled 2022-08-17 (×3): qty 1

## 2022-08-17 NOTE — Op Note (Signed)
NAME: Andres Carter, Andres Carter MEDICAL RECORD NO: [id number] ACCOUNT NO: [id number] DATE OF BIRTH: 09-07-1971 FACILITY: [facility] LOCATION: [id number] PHYSICIAN: [name], MD  Operative Report   DATE OF PROCEDURE: 08/16/2022  PREOPERATIVE DIAGNOSIS:  Large volume moderate risk prostate cancer.  PROCEDURES:  1.  Robotic-assisted laparoscopic radical prostatectomy. 2.  Bilateral pelvic lymphadenectomy.  ESTIMATED BLOOD LOSS:  50 mL.  COMPLICATIONS:  None.  SPECIMENS:  1.  Right external iliac lymph nodes. 2.  Right obturator lymph nodes. 3.  Right external iliac sentinel. 4.  Left external iliac lymph nodes. 5.  Left obturator lymph nodes. 6.  Radical prostatectomy.  FINDINGS:  1.  Significant pelvic adenopathy as previously expected. 2.  One small sentinel lymph node in the right external iliac group.  DRAINS: 1.  Jackson-Pratt drain to bulb suction. 2.  Foley catheter to straight drain.  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  He underwent staging imaging including prostate specific  PET/CT, which did not reveal any distant disease.  He does have some diffuse adenopathy, but again this is prostate negative, likely suggesting alternate etiology.  Options discussed for management including surveillance protocols versus ablative therapy  versus surgical extirpation and given his younger age and very good functional status, he wished to proceed with prostatectomy as primary therapy.  Informed consent was obtained and placed in medical record.  PROCEDURE IN DETAIL:  The patient being Andres Carter verified and the procedure being radical prostatectomy was confirmed.  Procedure timeout was performed.  Intravenous antibiotics were administered.  General endotracheal anesthesia induced.  The  patient was placed into a low lithotomy  position.  Sterile field was created, prepped and draped the patient's penis, perineum, and proximal thighs using iodine and his infra-xiphoid abdomen using chlorhexidine gluconate after he was clipper shaving and  after tucking his arms at his side and further fashioning the operative table using 3-inch tape with foam padding across supraxiphoid chest.  Test of steep Trendelenburg positioning was performed and found to be suitably positioned.  Foley catheter was  placed free to straight drain.  Next a high flow, low pressure, pneumoperitoneum was obtained using Veress technique in the supraumbilical midline having passed the aspiration and drop test.  He does have a significant volume of subcutaneous fat.  A 8 mm  camera port was placed in the location.  Laparoscopic examination of the peritoneal cavity revealed no significant adhesions, no visceral injury.  Additional ports were placed as follows:  Right paramedian 8 mm robotic port, right far lateral 12 mm  AirSeal assist port, left paramedian 8 mm robotic port, left far lateral 8 mm robotic port, right paramedian 5 mm suction port.  Robot was docked and passed the electronic checks.  Initial attention was directed at development of space of Retzius.   Incision was made lateral to the right median umbilical ligament from the midline towards the area of the internal ring, coursing along the iliac vessels towards the area of the right ureter.  The right vas deferens was encountered, ligated and used as a  medial bucket handle and the right bladder wall swept with pelvic sidewall towards the endopelvic fascia on the right side.  A mirror image dissection was performed on the left side.  Anterior attachments were taken down with cautery scissors to expose  the anterior base of the prostate, which was defatted  to better denote the bladder neck prostate junction with this fatty tissue set aside labeled as periprostatic fat.  Next, 0.2 mL of indocyanine green dye  was injected into each lobe of the prostate  using percutaneously placed robotically guided spinal needle for sentinel lymphangiography.  Next, the endopelvic fascia was carefully swept away from the prostate in a base to apex orientation.  This exposed the dorsal venous complex and this was  carefully controlled using green load stapler, taking exquisite care to avoid membranous urethral injury, which did not occur.  This resulted in excellent hemostasis of the dorsal venous complex.  It had been approximately 10 minutes post-dye injection  and the pelvis was inspected under near infrared fluorescence light.  Sentinel lymphangiography revealed excellent parenchymal uptake of the prostate.  There were several lymphatic channels seen coursing towards the lymph node fields bilaterally, mostly in a perivesical location on the left.  On the right side, there was a  dominant channel coursing towards the right external iliac lymph nodes.  There was a single dominant, relatively small lymph node along the channel and this was dissected free and labeled as right external iliac lymph nodes sentinel.  The patient does  have diffuse borderline adenopathy noted on prior imaging and this has been negative and this was noted intraoperatively as well and as such, the right external iliac group was dissected free with the boundaries being right external iliac artery, vein,  pelvic sidewall, iliac bifurcation and lymphostasis achieved with cold clips, set aside labeled as right external iliac lymph nodes.  Next, right obturator group was dissected free with the boundaries being right external iliac vein, pelvic sidewall,  obturator nerve.  Obturator nerve was inspected following maneuvers and found to be uninjured.  It was removed and set side labeled as right obturator lymph nodes.  Next, a mirror image lymphadenectomy was performed of the left side, of left external  iliac and left obturator groups respectively.  Again,  there was some borderline adenopathy, but none seen on the left.  Left obturator nerve was inspected following maneuvers and found to be uninjured.  Attention was then directed at bladder neck  dissection.  A lateral release was performed on each side to better denote the bladder neck prostate junction, which was carefully separated in anterior and posterior direction, keeping what appeared to be a rim of circular muscle fibers with each plane  of dissection.  This was performed purposely somewhat wide given his large volume of disease.  Posterior dissection was performed by incising approximately 7 mm inferior posterior to the posterior level of the prostate entering the plane of  Denonvilliers.  Bilateral seminal vesicles were encountered, dissected for approximately 4 cm, ligated, and placed on gentle superior traction.  Bilateral seminal vesicles were dissected to their tip and placed on gentle superior traction.  Dissection  proceeded inferiorly towards the area of the apex of the prostate.  This exposed the vascular pedicles on each side.  These were controlled using a sequential clipping technique in a base to apex orientation performing a purposeful wide dissection given  his very large volume disease.  Final apical dissection was performed in the anterior plane placing the prostate on gentle superior traction, transecting the membranous urethra coldly.  This completely freed up the prostatectomy specimen and this was  placed in EndoCatch bag for later retrieval.  Next, digital rectal exam was performed using indicator glove under laparoscopic vision.  No evidence of rectal violation was noted.  Posterior  reconstruction was performed using a 3-0 V-Loc suture,  reapproximating the posterior urethral plate to the posterior bladder neck, bringing the structures into tension-free apposition and mucosa-to-mucosa anastomosis was performed using double armed 3-0 V-Loc suture from the 6 o'clock to the 12  o'clock  position.  This resulted in excellent tension-free apposition of the bladder neck and membranous urethra and a Foley catheter was placed per urethra, which irrigated quantitatively.  Sponge and needle counts were correct.  Hemostasis was excellent.  We  achieved the goals of the extirpated portion of the procedure today.  A closed suction drain was brought out the previous left lateral most robotic port site into the area of the peritoneal cavity.  Robot was then undocked.  The right lateral most  assistant port site was closed at the level of the fascia using Carter-Thompson suture passer and 0 Vicryl.  Specimen was retrieved by extending the previous camera port site inferiorly for a total distance of approximately 3 cm, removing the  prostatectomy specimen and setting aside for permanent pathology.  Extraction site was closed to the fascia using figure-of-eight PDS x 3 followed by reapproximation of Scarpa's with running Vicryl.  All incision sites were infiltrated with dilute  lipolyzed Marcaine and closed at the level of skin using subcuticular Monocryl, followed by Dermabond.  Procedure was then terminated.  The patient tolerated procedure well, no immediate periprocedural complications.  The patient was taken to  postanesthesia care in stable condition with plan for observation admission.  Please note, first assistant, Debbrah Alar, was crucial for all portions of the surgery today.  She provided invaluable retraction, suctioning, specimen manipulation, robotic instrument exchange, vascular clipping, lymphatic clipping, vascular stapling  and general first assistance.   MUK D: 08/16/2022 5:17:30 pm T: 08/17/2022 12:33:00 am  JOB: [id number]/ [id number]

## 2022-08-17 NOTE — Progress Notes (Signed)
Pt arrived to floor from PACU at beginning of shift. Pt c/o pain 7/10 in lower abdomen/groin area. Foley in place with red output that gradually turned yellow throughout the shift. JP with roughly 23m output overnight. PRN pain medication used with good relief. Pt remains on IV fluids. Pt resting comfortably with no other complaints/concerns. Plan of care continues.

## 2022-08-17 NOTE — Progress Notes (Signed)
1 Day Post-Op   Subjective/Chief Complaint:  1 - Prostate Cancer - s/ robotic prostatectomy with pelvic node dissection 08/16/22. Path pending.  Today "Andres Carter" is stable and progressinve but slowly. Soreness limiting activity, but has walked in halls. No emesis.    Objective: Vital signs in last 24 hours: Temp:  [97.8 F (36.6 C)-99.9 F (37.7 C)] 98.5 F (36.9 C) (10/05 0834) Pulse Rate:  [80-106] 80 (10/05 0834) Resp:  [11-18] 18 (10/05 0834) BP: (114-158)/(68-93) 115/77 (10/05 0834) SpO2:  [95 %-100 %] 96 % (10/05 0834) Last BM Date : 08/16/22  Intake/Output from previous day: 10/04 0701 - 10/05 0700 In: 2259.3 [I.V.:2159.3; IV Piggyback:100] Out: 1105 [Urine:725; Drains:330; Blood:50] Intake/Output this shift: Total I/O In: -  Out: 610 [Urine:550; Drains:60]   NAD, AOx3, very pleasant Non-labored breathing on RA RRR SNTND, JP non-foul serosanguinous Recent surgical sites c/d/I Foley in place with non-foul medium yellow urine, no large clots SCD's in place.   Lab Results:  Recent Labs    08/16/22 1753 08/17/22 0649  HGB 13.5 10.9*  HCT 42.7 34.3*   BMET Recent Labs    08/17/22 0649  NA 135  K 4.4  CL 104  CO2 25  GLUCOSE 186*  BUN 15  CREATININE 1.16  CALCIUM 8.4*   PT/INR No results for input(s): "LABPROT", "INR" in the last 72 hours. ABG No results for input(s): "PHART", "HCO3" in the last 72 hours.  Invalid input(s): "PCO2", "PO2"  Studies/Results: No results found.  Anti-infectives: Anti-infectives (From admission, onward)    Start     Dose/Rate Route Frequency Ordered Stop   08/16/22 1227  ceFAZolin (ANCEF) IVPB 2g/100 mL premix        2 g 200 mL/hr over 30 Minutes Intravenous 30 min pre-op 08/16/22 1227 08/16/22 1510   08/16/22 0000  sulfamethoxazole-trimethoprim (BACTRIM DS) 800-160 MG tablet        1 tablet Oral 2 times daily 08/16/22 1400         Assessment/Plan:  Doing well POD 1. Goals for DC discussed, likely  tomorrow based on current progress. SLIV, Reg Diet, scheduled toradol, ambulate, keep JP for now.    Andres Carter 08/17/2022

## 2022-08-18 DIAGNOSIS — C61 Malignant neoplasm of prostate: Secondary | ICD-10-CM | POA: Diagnosis not present

## 2022-08-18 LAB — GLUCOSE, CAPILLARY: Glucose-Capillary: 140 mg/dL — ABNORMAL HIGH (ref 70–99)

## 2022-08-18 MED ORDER — CHLORHEXIDINE GLUCONATE CLOTH 2 % EX PADS
6.0000 | MEDICATED_PAD | Freq: Every day | CUTANEOUS | Status: DC
  Administered 2022-08-18: 6 via TOPICAL

## 2022-08-18 NOTE — TOC Initial Note (Signed)
Transition of Care [facility]) - Initial/Assessment Note    Patient Details  Name: Andres Carter MRN: [id number] Date of Birth: 1970/12/11  Transition of Care [facility]) CM/SW Contact:    Leeroy Cha, RN Phone Number: 08/18/2022, 10:06 AM  Clinical Narrative:                  Transition of Care (TOC) Screening Note   Patient Details  Name: Andres Carter Date of Birth: 1971-04-03   Transition of Care [facility]) CM/SW Contact:    Leeroy Cha, RN Phone Number: 08/18/2022, 10:06 AM    Transition of Care Department (TOC) has reviewed patient and no TOC needs have been identified at this time. We will continue to monitor patient advancement through interdisciplinary progression rounds. If new patient transition needs arise, please place a TOC consult.    Expected Discharge Plan: Home/Self Care Barriers to Discharge: Continued Medical Work up   Patient Goals and CMS Choice Patient states their goals for this hospitalization and ongoing recovery are:: to go home CMS Medicare.gov Compare Post Acute Care list provided to:: Patient    Expected Discharge Plan and Services Expected Discharge Plan: Home/Self Care       Living arrangements for the past 2 months: Single Family Home                                      Prior Living Arrangements/Services Living arrangements for the past 2 months: Single Family Home Lives with:: Spouse Patient language and need for interpreter reviewed:: Yes Do you feel safe going back to the place where you live?: Yes            Criminal Activity/Legal Involvement Pertinent to Current Situation/Hospitalization: No - Comment as needed  Activities of Daily Living Home Assistive Devices/Equipment: None ADL Screening (condition at time of admission) Patient's cognitive ability adequate to safely complete daily activities?: Yes Is the patient deaf or have difficulty hearing?: No Does the patient have difficulty seeing, even  when wearing glasses/contacts?: No Does the patient have difficulty concentrating, remembering, or making decisions?: No Patient able to express need for assistance with ADLs?: Yes Does the patient have difficulty dressing or bathing?: No Independently performs ADLs?: Yes (appropriate for developmental age) Does the patient have difficulty walking or climbing stairs?: No Weakness of Legs: None Weakness of Arms/Hands: None  Permission Sought/Granted                  Emotional Assessment Appearance:: Appears stated age Attitude/Demeanor/Rapport: Engaged Affect (typically observed): Calm Orientation: : Oriented to Situation, Oriented to  Time, Oriented to Place, Oriented to Self Alcohol / Substance Use: Not Applicable Psych Involvement: No (comment)  Admission diagnosis:  Prostate cancer [facility]) [C61] Patient Active Problem List   Diagnosis Date Noted   Prostate cancer ([place]) 08/16/2022   Malignant neoplasm of prostate ([place]) 06/21/2022   PCP:  Wilburt Finlay, MD Pharmacy:   [facility] [address], [address] [place] [address] Phone: [phone] Fax: [phone]     Social Determinants of Health (SDOH) Interventions    Readmission Risk Interventions   No data to display

## 2022-08-18 NOTE — Progress Notes (Signed)
Patient is being discharged home. All discharge instructions reviewed with patient and his wife at bedside. Wife and patient showed how to switch foley drainage bag to a leg bag. Patient and wife verbalized an understanding of all instructions. Patient provided with leg bag and standard drainage bag for home.

## 2022-08-18 NOTE — TOC Transition Note (Signed)
Transition of Care [facility]) - CM/SW Discharge Note   Patient Details  Name: Andres Carter MRN: [id number] Date of Birth: 1971-11-11  Transition of Care [facility]) CM/SW Contact:  Leeroy Cha, RN Phone Number: 08/18/2022, 11:16 AM   Clinical Narrative:    [id number] discharged to return home.  Chart reviewed for TOC needs.  None found.  Patient self care.   Final next level of care: Home/Self Care Barriers to Discharge: Barriers Resolved   Patient Goals and CMS Choice Patient states their goals for this hospitalization and ongoing recovery are:: to go home CMS Medicare.gov Compare Post Acute Care list provided to:: Patient    Discharge Placement                       Discharge Plan and Services                                     Social Determinants of Health (SDOH) Interventions     Readmission Risk Interventions   No data to display

## 2022-08-18 NOTE — Anesthesia Postprocedure Evaluation (Signed)
Anesthesia Post Note  Patient: Andres Carter  Procedure(s) Performed: XI ROBOTIC ASSISTED LAPAROSCOPIC RADICAL PROSTATECTOMY WITH INDOCYANINE GREEN DYE LYMPH NODE DISSECTION (Bilateral)     Patient location during evaluation: PACU Anesthesia Type: General Level of consciousness: awake and alert Pain management: pain level controlled Vital Signs Assessment: post-procedure vital signs reviewed and stable Respiratory status: spontaneous breathing, nonlabored ventilation, respiratory function stable and patient connected to nasal cannula oxygen Cardiovascular status: blood pressure returned to baseline and stable Postop Assessment: no apparent nausea or vomiting Anesthetic complications: no   No notable events documented.  Last Vitals:  Vitals:   08/17/22 2026 08/18/22 0505  BP: 106/63 121/69  Pulse: 94 99  Resp: 17 18  Temp: 37.1 C 37.3 C  SpO2: 99% 98%    Last Pain:  Vitals:   08/18/22 0804  TempSrc:   PainSc: [place]

## 2022-08-18 NOTE — Discharge Summary (Signed)
Physician Discharge Summary  Patient ID: Andres Carter MRN: [id number] DOB/AGE: 01/26/1971 51 y.o.  Admit date: 08/16/2022 Discharge date: 08/18/2022  Admission Diagnoses: Prostate Cancer  Discharge Diagnoses:  Principal Problem:   Prostate cancer [facility])   Discharged Condition: good  Hospital Course: Pt underwent robotic prostatectomy with node dissection on 08/16/22, the day of admission, without acute complications. Observed on the Urology service post op where he began vigorous recovery. By the AM of POD 2, the day of discharge, he is ambulatory, pain controlled on PO meds, maintainig PO hydration, and felt to be adequate for discharge. Hgb 10.9, Cr <1.5, path pending at discharge. JP removed prior to discharge.   Consults: None  Significant Diagnostic Studies: labs: as per above  Treatments: surgery: as per above  Discharge Exam: Blood pressure 113/66, pulse 84, temperature 99.1 F (37.3 C), temperature source Oral, resp. rate 18, height '5\' 6"'$  (1.676 m), weight 96.6 kg, SpO2 98 %.  NAD, AOx3, very pleasant, wife at bedside.  Non-labored breathing on RA RRR SNTND, JP non-foul serosanguinous. Removed and dry dressing applied.  Recent surgical sites c/d/I Foley in place with non-foul medium yellow urine, no clots SCD's in place.   Disposition: HOME   Allergies as of 08/18/2022   No Known Allergies      Medication List     STOP taking these medications    VITAMIN B-12 PO       TAKE these medications    albuterol 108 (90 Base) MCG/ACT inhaler Commonly known as: VENTOLIN HFA Inhale 2 puffs into the lungs every 6 (six) hours as needed for wheezing or shortness of breath.   atorvastatin 10 MG tablet Commonly known as: LIPITOR Take 10 mg by mouth at bedtime.   docusate sodium 100 MG capsule Commonly known as: COLACE Take 1 capsule (100 mg total) by mouth 2 (two) times daily.   HYDROcodone-acetaminophen 5-325 MG tablet Commonly known as: Norco Take 1-2  tablets by mouth every 6 (six) hours as needed for moderate pain or severe pain.   lisinopril 5 MG tablet Commonly known as: ZESTRIL Take 5 mg by mouth daily.   metFORMIN 500 MG 24 hr tablet Commonly known as: GLUCOPHAGE-XR Take 500 mg by mouth daily.   Ozempic (2 MG/DOSE) 8 MG/3ML Sopn Generic drug: Semaglutide (2 MG/DOSE) Inject 2 mg into the skin See admin instructions. Inject 2 mg once weekly on Tuesdays   sildenafil 100 MG tablet Commonly known as: VIAGRA Take 100 mg by mouth daily.   sulfamethoxazole-trimethoprim 800-160 MG tablet Commonly known as: BACTRIM DS Take 1 tablet by mouth 2 (two) times daily. Start the day prior to foley removal appointment        Follow-up Information     [name], MD Follow up on 08/28/2022.   Specialty: Urology Why: at 9:45 for MD visit, pathology review, and cather removal. Contact information: [place] [address] [phone]                 Signed: [name] 08/18/2022, 11:05 AM

## 2022-08-23 LAB — SURGICAL PATHOLOGY

## 2022-12-29 DIAGNOSIS — I10 Essential (primary) hypertension: Secondary | ICD-10-CM | POA: Insufficient documentation

## 2023-01-19 ENCOUNTER — Other Ambulatory Visit: Payer: 59

## 2023-01-20 LAB — PSA: Prostate Specific Ag, Serum: <0.1 ng/mL (ref 0.0–4.0)

## 2023-01-26 ENCOUNTER — Ambulatory Visit: Payer: 59 | Admitting: Urology

## 2023-03-09 ENCOUNTER — Ambulatory Visit: Payer: Managed Care, Other (non HMO) | Admitting: Urology

## 2023-03-09 VITALS — BP 138/85 | HR 62

## 2023-03-09 DIAGNOSIS — N5231 Erectile dysfunction following radical prostatectomy: Secondary | ICD-10-CM | POA: Diagnosis not present

## 2023-03-09 DIAGNOSIS — C61 Malignant neoplasm of prostate: Secondary | ICD-10-CM

## 2023-03-09 LAB — URINALYSIS, ROUTINE W REFLEX MICROSCOPIC
Bilirubin, UA: NEGATIVE
Glucose, UA: NEGATIVE
Ketones, UA: NEGATIVE
Leukocytes,UA: NEGATIVE
Nitrite, UA: NEGATIVE
Specific Gravity, UA: 1.02 (ref 1.005–1.030)
Urobilinogen, Ur: 1 mg/dL (ref 0.2–1.0)
pH, UA: 6.5 (ref 5.0–7.5)

## 2023-03-09 LAB — MICROSCOPIC EXAMINATION: Bacteria, UA: NONE SEEN

## 2023-03-09 MED ORDER — AMBULATORY NON FORMULARY MEDICATION: 0.2000 mL | 5 refills | Status: DC | PRN

## 2023-03-09 NOTE — Progress Notes (Unsigned)
03/09/2023 9:53 AM   Patty Sermons 03/28/1971 [id number]  Referring provider: Beatrix Fetters, MD [address] [place],  [place] [address]  Followup prostate cancer and erectile dysfunction   HPI: Mr Georg is a 51yo here for followup for Prostate cancer and erectile dysfunction. He has been doing well since radical prostatectomy. PSA undetectable. 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 No erections even with sildenafil.    PMH: Past Medical History:  Diagnosis Date   Arthritis    gout   Cancer (HCC)    Prostate   Diabetes mellitus without complication (HCC)    Hypertension     Surgical History: Past Surgical History:  Procedure Laterality Date   LYMPH NODE DISSECTION Bilateral 08/16/2022   Procedure: LYMPH NODE DISSECTION;  Surgeon: Sebastian Ache, MD;  Location: [facility];  Service: Urology;  Laterality: Bilateral;   NO PAST SURGERIES     ROBOT ASSISTED LAPAROSCOPIC RADICAL PROSTATECTOMY N/A 08/16/2022   Procedure: XI ROBOTIC ASSISTED LAPAROSCOPIC RADICAL PROSTATECTOMY WITH INDOCYANINE GREEN DYE;  Surgeon: Sebastian Ache, MD;  Location: [facility];  Service: Urology;  Laterality: N/A;    Home Medications:  Allergies as of 03/09/2023   No Known Allergies      Medication List        Accurate as of March 09, 2023  9:53 AM. If you have any questions, ask your nurse or doctor.          albuterol 108 (90 Base) MCG/ACT inhaler Commonly known as: VENTOLIN HFA Inhale 2 puffs into the lungs every 6 (six) hours as needed for wheezing or shortness of breath.   atorvastatin 10 MG tablet Commonly known as: LIPITOR Take 10 mg by mouth at bedtime.   docusate sodium 100 MG capsule Commonly known as: COLACE Take 1 capsule (100 mg total) by mouth 2 (two) times daily.   HYDROcodone-acetaminophen 5-325 MG tablet Commonly known as: Norco Take 1-2 tablets by mouth every 6 (six) hours as needed for moderate pain or severe pain.   lisinopril 5 MG tablet Commonly known  as: ZESTRIL Take 5 mg by mouth daily.   metFORMIN 500 MG 24 hr tablet Commonly known as: GLUCOPHAGE-XR Take 500 mg by mouth daily.   Ozempic (2 MG/DOSE) 8 MG/3ML Sopn Generic drug: Semaglutide (2 MG/DOSE) Inject 2 mg into the skin See admin instructions. Inject 2 mg once weekly on Tuesdays   sildenafil 100 MG tablet Commonly known as: VIAGRA Take 100 mg by mouth daily.   sulfamethoxazole-trimethoprim 800-160 MG tablet Commonly known as: BACTRIM DS Take 1 tablet by mouth 2 (two) times daily. Start the day prior to foley removal appointment        Allergies: No Known Allergies  Family History: Family History  Problem Relation Age of Onset   Diabetes Mother    Cancer Father    Diabetes Father     Social History:  reports that he has never smoked. He does not have any smokeless tobacco history on file. He reports that he does not drink alcohol and does not use drugs.  ROS: All other review of systems were reviewed and are negative except what is noted above in HPI  Physical Exam: BP 138/85   Pulse 62   Constitutional:  Alert and oriented, No acute distress. HEENT: [place] AT, moist mucus membranes.  Trachea midline, no masses. Cardiovascular: No clubbing, cyanosis, or edema. Respiratory: Normal respiratory effort, no increased work of breathing. GI: Abdomen is soft, nontender, nondistended, no abdominal masses  GU: No CVA tenderness.  Lymph: No cervical or inguinal lymphadenopathy. Skin: No rashes, bruises or suspicious lesions. Neurologic: Grossly intact, no focal deficits, moving all 4 extremities. Psychiatric: Normal mood and affect.  Laboratory Data: Lab Results  Component Value Date   WBC 5.8 08/07/2022   HGB 10.9 (L) 08/17/2022   HCT 34.3 (L) 08/17/2022   MCV 89.3 08/07/2022   PLT 365 08/07/2022    Lab Results  Component Value Date   CREATININE 1.16 08/17/2022    No results found for: "PSA"  No results found for: "TESTOSTERONE"  Lab Results   Component Value Date   HGBA1C 7.0 (H) 08/07/2022    Urinalysis    Component Value Date/Time   APPEARANCEUR Clear 07/05/2022 1208   GLUCOSEU Trace (A) 07/05/2022 1208   BILIRUBINUR Negative 07/05/2022 1208   PROTEINUR 1+ (A) 07/05/2022 1208   NITRITE Negative 07/05/2022 1208   LEUKOCYTESUR Negative 07/05/2022 1208    Lab Results  Component Value Date   LABMICR See below: 07/05/2022   WBCUA None seen 07/05/2022   LABEPIT 0-10 07/05/2022   MUCUS Present 07/05/2022   BACTERIA None seen 07/05/2022    Pertinent Imaging:  No results found for this or any previous visit.  No results found for this or any previous visit.  No results found for this or any previous visit.  No results found for this or any previous visit.  No results found for this or any previous visit.  No valid procedures specified. No results found for this or any previous visit.  No results found for this or any previous visit.   Assessment & Plan:    1. Prostate cancer (HCC) -PSA in 6 months - Urinalysis, Routine w reflex microscopic  2. Erectile dysfunction after radical prostatectomy Patient wishes to proceed with trimix therapy   No follow-ups on file.  Wilkie Aye, MD  [facility] [place]

## 2023-03-13 ENCOUNTER — Encounter: Payer: Self-pay | Admitting: Urology

## 2023-03-13 NOTE — Patient Instructions (Signed)
Erectile Dysfunction ?Erectile dysfunction (ED) is the inability to get or keep an erection in order to have sexual intercourse. ED is considered a symptom of an underlying disorder and is not considered a disease. ED may include: ?Inability to get an erection. ?Lack of enough hardness of the erection to allow penetration. ?Loss of erection before sex is finished. ?What are the causes? ?This condition may be caused by: ?Physical causes, such as: ?Artery problems. This may include heart disease, high blood pressure, atherosclerosis, and diabetes. ?Hormonal problems, such as low testosterone. ?Obesity. ?Nerve problems. This may include back or pelvic injuries, multiple sclerosis, Parkinson's disease, spinal cord injury, and stroke. ?Certain medicines, such as: ?Pain relievers. ?Antidepressants. ?Blood pressure medicines and water pills (diuretics). ?Cancer medicines. ?Antihistamines. ?Muscle relaxants. ?Lifestyle factors, such as: ?Use of drugs such as marijuana, cocaine, or opioids. ?Excessive use of alcohol. ?Smoking. ?Lack of physical activity or exercise. ?Psychological causes, such as: ?Anxiety or stress. ?Sadness or depression. ?Exhaustion. ?Fear about sexual performance. ?Guilt. ?What are the signs or symptoms? ?Symptoms of this condition include: ?Inability to get an erection. ?Lack of enough hardness of the erection to allow penetration. ?Loss of the erection before sex is finished. ?Sometimes having normal erections, but with frequent unsatisfactory episodes. ?Low sexual satisfaction in either partner due to erection problems. ?A curved penis occurring with erection. The curve may cause pain, or the penis may be too curved to allow for intercourse. ?Never having nighttime or morning erections. ?How is this diagnosed? ?This condition is often diagnosed by: ?Performing a physical exam to find other diseases or specific problems with the penis. ?Asking you detailed questions about the problem. ?Doing tests,  such as: ?Blood tests to check for diabetes mellitus or high cholesterol, or to measure hormone levels. ?Other tests to check for underlying health conditions. ?An ultrasound exam to check for scarring. ?A test to check blood flow to the penis. ?Doing a sleep study at home to measure nighttime erections. ?How is this treated? ?This condition may be treated by: ?Medicines, such as: ?Medicine taken by mouth to help you achieve an erection (oral medicine). ?Hormone replacement therapy to replace low testosterone levels. ?Medicine that is injected into the penis. Your health care provider may instruct you how to give yourself these injections at home. ?Medicine that is delivered with a short applicator tube. The tube is inserted into the opening at the tip of the penis, which is the opening of the urethra. A tiny pellet of medicine is put in the urethra. The pellet dissolves and enhances erectile function. This is also called MUSE (medicated urethral system for erections) therapy. ?Vacuum pump. This is a pump with a ring on it. The pump and ring are placed on the penis and used to create pressure that helps the penis become erect. ?Penile implant surgery. In this procedure, you may receive: ?An inflatable implant. This consists of cylinders, a pump, and a reservoir. The cylinders can be inflated with a fluid that helps to create an erection, and they can be deflated after intercourse. ?A semi-rigid implant. This consists of two silicone rubber rods. The rods provide some rigidity. They are also flexible, so the penis can both curve downward in its normal position and become straight for sexual intercourse. ?Blood vessel surgery to improve blood flow to the penis. During this procedure, a blood vessel from a different part of the body is placed into the penis to allow blood to flow around (bypass) damaged or blocked blood vessels. ?Lifestyle changes,   such as exercising more, losing weight, and quitting smoking. ?Follow  these instructions at home: ?Medicines ? ?Take over-the-counter and prescription medicines only as told by your health care provider. Do not increase the dosage without first discussing it with your health care provider. ?If you are using self-injections, do injections as directed by your health care provider. Make sure you avoid any veins that are on the surface of the penis. After giving an injection, apply pressure to the injection site for 5 minutes. ?Talk to your health care provider about how to prevent headaches while taking ED medicines. These medicines may cause a sudden headache due to the increase in blood flow in your body. ?General instructions ?Exercise regularly, as directed by your health care provider. Work with your health care provider to lose weight, if needed. ?Do not use any products that contain nicotine or tobacco. These products include cigarettes, chewing tobacco, and vaping devices, such as e-cigarettes. If you need help quitting, ask your health care provider. ?Before using a vacuum pump, read the instructions that come with the pump and discuss any questions with your health care provider. ?Keep all follow-up visits. This is important. ?Contact a health care provider if: ?You feel nauseous. ?You are vomiting. ?You get sudden headaches while taking ED medicines. ?You have any concerns about your sexual health. ?Get help right away if: ?You are taking oral or injectable medicines and you have an erection that lasts longer than 4 hours. If your health care provider is unavailable, go to the nearest emergency room for evaluation. An erection that lasts much longer than 4 hours can result in permanent damage to your penis. ?You have severe pain in your groin or abdomen. ?You develop redness or severe swelling of your penis. ?You have redness spreading at your groin or lower abdomen. ?You are unable to urinate. ?You experience chest pain or a rapid heartbeat (palpitations) after taking oral  medicines. ?These symptoms may represent a serious problem that is an emergency. Do not wait to see if the symptoms will go away. Get medical help right away. Call your local emergency services (911 in the [place] Do not drive yourself to the hospital. ?Summary ?Erectile dysfunction (ED) is the inability to get or keep an erection during sexual intercourse. ?This condition is diagnosed based on a physical exam, your symptoms, and tests to determine the cause. Treatment varies depending on the cause and may include medicines, hormone therapy, surgery, or a vacuum pump. ?You may need follow-up visits to make sure that you are using your medicines or devices correctly. ?Get help right away if you are taking or injecting medicines and you have an erection that lasts longer than 4 hours. ?This information is not intended to replace advice given to you by your health care provider. Make sure you discuss any questions you have with your health care provider. ?Document Revised: 01/26/2021 Document Reviewed: 01/26/2021 ?Elsevier Patient Education ? 2023 [employer] ? ?

## 2023-04-11 ENCOUNTER — Ambulatory Visit: Payer: Managed Care, Other (non HMO) | Admitting: Urology

## 2023-04-11 ENCOUNTER — Encounter: Payer: Self-pay | Admitting: Urology

## 2023-04-11 VITALS — BP 131/80 | HR 78

## 2023-04-11 DIAGNOSIS — N5231 Erectile dysfunction following radical prostatectomy: Secondary | ICD-10-CM

## 2023-04-11 DIAGNOSIS — C61 Malignant neoplasm of prostate: Secondary | ICD-10-CM

## 2023-04-12 NOTE — Progress Notes (Signed)
Patient rescheduled.

## 2023-04-30 ENCOUNTER — Encounter: Payer: Self-pay | Admitting: Urology

## 2023-04-30 ENCOUNTER — Ambulatory Visit (INDEPENDENT_AMBULATORY_CARE_PROVIDER_SITE_OTHER): Payer: Managed Care, Other (non HMO) | Admitting: Urology

## 2023-04-30 VITALS — BP 110/73 | HR 77

## 2023-04-30 DIAGNOSIS — N5231 Erectile dysfunction following radical prostatectomy: Secondary | ICD-10-CM

## 2023-04-30 NOTE — Progress Notes (Unsigned)
04/30/2023 9:53 AM   Patty Sermons 1971/04/23 [id number]  Referring provider: Beatrix Fetters, MD [address] [place],  [place] [address]  No chief complaint on file.   HPI:  Pt of Dr. Madison Hickman here for trimix teaching. I went over the nature r/b/a to trimix including priapism and how to deal with it.    PMH: Past Medical History:  Diagnosis Date   Arthritis    gout   Cancer (HCC)    Prostate   Diabetes mellitus without complication (HCC)    Hypertension     Surgical History: Past Surgical History:  Procedure Laterality Date   LYMPH NODE DISSECTION Bilateral 08/16/2022   Procedure: LYMPH NODE DISSECTION;  Surgeon: Sebastian Ache, MD;  Location: [facility];  Service: Urology;  Laterality: Bilateral;   NO PAST SURGERIES     ROBOT ASSISTED LAPAROSCOPIC RADICAL PROSTATECTOMY N/A 08/16/2022   Procedure: XI ROBOTIC ASSISTED LAPAROSCOPIC RADICAL PROSTATECTOMY WITH INDOCYANINE GREEN DYE;  Surgeon: Sebastian Ache, MD;  Location: [facility];  Service: Urology;  Laterality: N/A;    Home Medications:  Allergies as of 04/30/2023   No Known Allergies      Medication List        Accurate as of April 30, 2023  9:53 AM. If you have any questions, ask your nurse or doctor.          albuterol 108 (90 Base) MCG/ACT inhaler Commonly known as: VENTOLIN HFA Inhale 2 puffs into the lungs every 6 (six) hours as needed for wheezing or shortness of breath.   AMBULATORY NON FORMULARY MEDICATION 0.2 mLs by Intracavernosal route as needed. Medication Name: Trimix  PGE Pap 30mg  Phent 1mg    atorvastatin 10 MG tablet Commonly known as: LIPITOR Take 10 mg by mouth at bedtime.   docusate sodium 100 MG capsule Commonly known as: COLACE Take 1 capsule (100 mg total) by mouth 2 (two) times daily.   gabapentin 100 MG capsule Commonly known as: NEURONTIN TAKE 1 CAPSULE BY MOUTH NIGHTLY AS NEEDED   HYDROcodone-acetaminophen 5-325 MG tablet Commonly known as: Norco Take 1-2 tablets by  mouth every 6 (six) hours as needed for moderate pain or severe pain.   lisinopril 5 MG tablet Commonly known as: ZESTRIL Take 5 mg by mouth daily.   metFORMIN 500 MG 24 hr tablet Commonly known as: GLUCOPHAGE-XR Take 500 mg by mouth daily.   Ozempic (2 MG/DOSE) 8 MG/3ML Sopn Generic drug: Semaglutide (2 MG/DOSE) Inject 2 mg into the skin See admin instructions. Inject 2 mg once weekly on Tuesdays   sildenafil 100 MG tablet Commonly known as: VIAGRA Take 100 mg by mouth daily.   sulfamethoxazole-trimethoprim 800-160 MG tablet Commonly known as: BACTRIM DS Take 1 tablet by mouth 2 (two) times daily. Start the day prior to foley removal appointment        Allergies: No Known Allergies  Family History: Family History  Problem Relation Age of Onset   Diabetes Mother    Cancer Father    Diabetes Father     Social History:  reports that he has never smoked. He does not have any smokeless tobacco history on file. He reports that he does not drink alcohol and does not use drugs.   PROCEDURE: the right corpora was prepped with alcohol swab and 0.5 ml of Trimix (PGE 30 mcg/ml/Pap/Phent). He had about 50% tumescence p 15 minutes.   Laboratory Data: Lab Results  Component Value Date   WBC 5.8 08/07/2022   HGB 10.9 (  L) 08/17/2022   HCT 34.3 (L) 08/17/2022   MCV 89.3 08/07/2022   PLT 365 08/07/2022    Lab Results  Component Value Date   CREATININE 1.16 08/17/2022    No results found for: "PSA"  No results found for: "TESTOSTERONE"  Lab Results  Component Value Date   HGBA1C 7.0 (H) 08/07/2022    Urinalysis    Component Value Date/Time   APPEARANCEUR Clear 03/09/2023 0945   GLUCOSEU Negative 03/09/2023 0945   BILIRUBINUR Negative 03/09/2023 0945   PROTEINUR Trace 03/09/2023 0945   NITRITE Negative 03/09/2023 0945   LEUKOCYTESUR Negative 03/09/2023 0945    Lab Results  Component Value Date   LABMICR See below: 03/09/2023   WBCUA 0-5 03/09/2023   LABEPIT  0-10 03/09/2023   MUCUS Present 07/05/2022   BACTERIA None seen 03/09/2023    Pertinent Imaging: N/a  Assessment & Plan:    ED - s/p TRIMIX injection and teaching. He will inject 0.5 - 1 mL going forward. Again discussed priapism - go to ED or call office immediately.   No follow-ups on file.  Jerilee Field, MD  [facility]  [address] [place], [place] [address] [phone]

## 2024-05-12 ENCOUNTER — Encounter: Payer: Self-pay | Admitting: *Deleted

## 2024-05-29 ENCOUNTER — Ambulatory Visit: Payer: Self-pay | Admitting: Nurse Practitioner

## 2024-06-09 ENCOUNTER — Ambulatory Visit: Payer: Self-pay | Admitting: Nurse Practitioner

## 2024-06-09 ENCOUNTER — Encounter: Payer: Self-pay | Admitting: Nurse Practitioner

## 2024-06-09 ENCOUNTER — Ambulatory Visit (INDEPENDENT_AMBULATORY_CARE_PROVIDER_SITE_OTHER): Admitting: *Deleted

## 2024-06-09 ENCOUNTER — Ambulatory Visit (INDEPENDENT_AMBULATORY_CARE_PROVIDER_SITE_OTHER): Payer: Self-pay | Admitting: Nurse Practitioner

## 2024-06-09 ENCOUNTER — Ambulatory Visit (INDEPENDENT_AMBULATORY_CARE_PROVIDER_SITE_OTHER)

## 2024-06-09 VITALS — BP 128/73 | HR 64 | Temp 98.7°F | Ht 66.0 in | Wt 220.2 lb

## 2024-06-09 DIAGNOSIS — E782 Mixed hyperlipidemia: Secondary | ICD-10-CM | POA: Diagnosis not present

## 2024-06-09 DIAGNOSIS — I10 Essential (primary) hypertension: Secondary | ICD-10-CM | POA: Diagnosis not present

## 2024-06-09 DIAGNOSIS — C61 Malignant neoplasm of prostate: Secondary | ICD-10-CM

## 2024-06-09 DIAGNOSIS — E66812 Obesity, class 2: Secondary | ICD-10-CM | POA: Insufficient documentation

## 2024-06-09 DIAGNOSIS — R972 Elevated prostate specific antigen [PSA]: Secondary | ICD-10-CM

## 2024-06-09 DIAGNOSIS — E119 Type 2 diabetes mellitus without complications: Secondary | ICD-10-CM

## 2024-06-09 DIAGNOSIS — Z6835 Body mass index (BMI) 35.0-35.9, adult: Secondary | ICD-10-CM

## 2024-06-09 DIAGNOSIS — Z125 Encounter for screening for malignant neoplasm of prostate: Secondary | ICD-10-CM

## 2024-06-09 DIAGNOSIS — R351 Nocturia: Secondary | ICD-10-CM

## 2024-06-09 DIAGNOSIS — M25511 Pain in right shoulder: Secondary | ICD-10-CM | POA: Diagnosis not present

## 2024-06-09 DIAGNOSIS — Z0001 Encounter for general adult medical examination with abnormal findings: Secondary | ICD-10-CM | POA: Insufficient documentation

## 2024-06-09 LAB — BAYER DCA HB A1C WAIVED: HB A1C (BAYER DCA - WAIVED): 10.8 % — ABNORMAL HIGH (ref 4.8–5.6)

## 2024-06-09 LAB — HM DIABETES EYE EXAM

## 2024-06-09 LAB — LIPID PANEL

## 2024-06-09 MED ORDER — METFORMIN HCL 1000 MG PO TABS: 1000.0000 mg | ORAL_TABLET | Freq: Two times a day (BID) | ORAL | 3 refills | Status: DC

## 2024-06-09 MED ORDER — SEMAGLUTIDE(0.25 OR 0.5MG/DOS) 2 MG/3ML ~~LOC~~ SOPN: 0.5000 mg | PEN_INJECTOR | SUBCUTANEOUS | 0 refills | Status: AC

## 2024-06-09 MED ORDER — PREDNISONE 10 MG PO TABS: 10.0000 mg | ORAL_TABLET | Freq: Every day | ORAL | 0 refills | Status: DC

## 2024-06-09 MED ORDER — ATORVASTATIN CALCIUM 10 MG PO TABS: 10.0000 mg | ORAL_TABLET | Freq: Every day | ORAL | 0 refills | Status: DC

## 2024-06-09 MED ORDER — TIZANIDINE HCL 2 MG PO CAPS: 2.0000 mg | ORAL_CAPSULE | Freq: Three times a day (TID) | ORAL | 0 refills | Status: DC | PRN

## 2024-06-09 NOTE — Progress Notes (Signed)
 On 1111 NIRS that she is Damien is a new patient yeah yeah why did not schedule last  New Patient Office Visit  Subjective   Patient ID: Andres Carter, male    DOB: 1971-04-02  Age: 53 y.o. MRN: [id number]  CC:  Chief Complaint  Patient presents with   New Patient (Initial Visit)   Shoulder Pain    Right should pain for a mth pain is at a 3 right now. No injury.    HPI Andres Carter is 53 yrs old male presents 06/09/2024 to establish care concnrs right shoulder pain PMH diabetes, HTN, hyperlipidemia  Shoulder Pain:  The patient presents with complaints of right shoulder pain, evaluated today as a personal injury. The pain is described as both aching and sharp in nature, located anteriorly. Onset was sudden and has been ongoing for approximately one month. The patient is unaware of a specific cause or inciting event. There is no reported mechanism of injury or history of dislocation.  Pain is primarily provoked by activity and worsened with lifting, pulling, pushing, carrying, and twisting. Over-the-counter Tylenol  and ibuprofen provide some relief. The patient denies associated symptoms such as stiffness, weakness, or crepitus. Despite the pain, he has not missed work. He is employed as a heavy Loss adjuster, chartered (blocker/mover), which requires frequent heavy lifting and physical exertion.  He reports previously receiving a shoulder injection, but states it did not improve his symptoms.    Diabetes Mellitus Type II  Lab Results  Component Value Date   HGBA1C 7.0 (H) 08/07/2022   Wt Readings from Last 3 Encounters:  06/09/24 220 lb 3.2 oz (99.9 kg)  08/16/22 212 lb 15.4 oz (96.6 kg)  08/07/22 213 lb (96.6 kg)   He has a dx of diabetes and was being managed with Ozempic  and metformin    Has not take Ozempic  for 46-months. Had prostate cance rin 2023 and feel like it exacerbated the sided of Ozempic  increase N/V He reports poor compliance with treatment. He is having side  effects. N/V Symptoms: No fatigue No foot ulcerations  No appetite changes No nausea  No paresthesia of the feet  No polydipsia  No polyuria No visual disturbances   No vomiting     Home blood sugar records: not being checked  Episodes of hypoglycemia? No    Current insulin  regiment:not on insulin  last A1c from 2023 7.0 % Most Recent Eye Exam: need to make appointment Current exercise: none Current diet habits: on average, 2 meals per day  Pertinent Labs: No results found for: CHOL, HDL, LDLCALC, LDLDIRECT, TRIG, CHOLHDL Lab Results  Component Value Date   NA 135 08/17/2022   K 4.4 08/17/2022   CREATININE 1.16 08/17/2022   GFRNONAA >60 08/17/2022      Lipid/Cholesterol  Last lipid panel Other pertinent labs  No results found for: CHOL, HDL, LDLCALC, LDLDIRECT, TRIG, CHOLHDL Lab Results  Component Value Date   PLT 365 08/07/2022     He has a diagnosis of hyperlipidemia which was being managed with Lipitor 10 mg daily reports he has not been taking Lipitor for over 5 months now.  He is agreeable to restarting Lipitor Symptoms: No chest pain No chest pressure/discomfort  No dyspnea No lower extremity edema  No numbness or tingling of extremity No orthopnea  No palpitations No paroxysmal nocturnal dyspnea  No speech difficulty No syncope   Current diet: in general, an unhealthy diet Current exercise: none  The 10-year ASCVD risk score (Arnett DK, et al.,  2019) is: 14.9%  Hypertension, follow-up  BP Readings from Last 3 Encounters:  06/09/24 128/73  04/30/23 110/73  04/11/23 131/80   Wt Readings from Last 3 Encounters:  06/09/24 220 lb 3.2 oz (99.9 kg)  08/16/22 212 lb 15.4 oz (96.6 kg)  08/07/22 213 lb (96.6 kg)     Has a diagnosis of hypertension and was prescribed lisinopril  5 mg daily which she reports has not been taking for 5 months BP at that visit was 110/73.  He reports poor compliance with treatment. He is not having side  effects.  He is following a Regular diet. He is not exercising. He does not smoke.  Use of agents associated with hypertension: none.   Outside blood pressures are not being monitored. Symptoms: No chest pain No chest pressure  No palpitations No syncope  No dyspnea No orthopnea  No paroxysmal nocturnal dyspnea No lower extremity edema   Pertinent labs No results found for: CHOL, HDL, LDLCALC, LDLDIRECT, TRIG, CHOLHDL Lab Results  Component Value Date   NA 135 08/17/2022   K 4.4 08/17/2022   CREATININE 1.16 08/17/2022   GFRNONAA >60 08/17/2022   GLUCOSE 186 (H) 08/17/2022     The 10-year ASCVD risk score (Arnett DK, et al., 2019) is: 14.9%  ---------------------------------------------------------------------------------------------------      06/09/2024    3:15 PM 06/09/2024    3:03 PM 09/03/2015    4:51 PM  PHQ9 SCORE ONLY  PHQ-9 Total Score 3 1 0      Data saved with a previous flowsheet row definition       06/09/2024    3:15 PM 06/09/2024    3:04 PM  GAD 7 : Generalized Anxiety Score  Nervous, Anxious, on Edge 0 0  Control/stop worrying 0 0  Worry too much - different things 1 0  Trouble relaxing 0 0  Restless 0 0  Easily annoyed or irritable 0 0  Afraid - awful might happen 1 0  Total GAD 7 Score 2 0  Anxiety Difficulty Not difficult at all Not difficult at all     Outpatient Encounter Medications as of 06/09/2024  Medication Sig   albuterol  (VENTOLIN  HFA) 108 (90 Base) MCG/ACT inhaler Inhale 2 puffs into the lungs every 6 (six) hours as needed for wheezing or shortness of breath.   lisinopril  (ZESTRIL ) 5 MG tablet Take 5 mg by mouth daily.   metFORMIN  (GLUCOPHAGE ) 1000 MG tablet Take 1 tablet (1,000 mg total) by mouth 2 (two) times daily with a meal.   predniSONE  (DELTASONE ) 10 MG tablet Take 1 tablet (10 mg total) by mouth daily with breakfast.   Semaglutide ,0.25 or 0.5MG /DOS, 2 MG/3ML SOPN Inject 0.5 mg into the skin every 7 (seven) days.    tizanidine  (ZANAFLEX ) 2 MG capsule Take 1 capsule (2 mg total) by mouth 3 (three) times daily as needed for muscle spasms.   [DISCONTINUED] atorvastatin  (LIPITOR) 10 MG tablet Take 10 mg by mouth at bedtime.   [DISCONTINUED] metFORMIN  (GLUCOPHAGE -XR) 500 MG 24 hr tablet Take 500 mg by mouth daily.   [DISCONTINUED] OZEMPIC , 2 MG/DOSE, 8 MG/3ML SOPN Inject 2 mg into the skin See admin instructions. Inject 2 mg once weekly on Tuesdays   [DISCONTINUED] sildenafil (VIAGRA) 100 MG tablet Take 100 mg by mouth daily.   atorvastatin  (LIPITOR) 10 MG tablet Take 1 tablet (10 mg total) by mouth at bedtime.   [DISCONTINUED] AMBULATORY NON FORMULARY MEDICATION 0.2 mLs by Intracavernosal route as needed. Medication Name: Trimix  PGE 30mcg Pap  30mg  Phent 1mg  (Patient not taking: Reported on 06/09/2024)   [DISCONTINUED] docusate sodium  (COLACE) 100 MG capsule Take 1 capsule (100 mg total) by mouth 2 (two) times daily.   [DISCONTINUED] gabapentin (NEURONTIN) 100 MG capsule TAKE 1 CAPSULE BY MOUTH NIGHTLY AS NEEDED (Patient not taking: Reported on 06/09/2024)   [DISCONTINUED] HYDROcodone -acetaminophen  (NORCO) 5-325 MG tablet Take 1-2 tablets by mouth every 6 (six) hours as needed for moderate pain or severe pain.   [DISCONTINUED] sulfamethoxazole -trimethoprim  (BACTRIM  DS) 800-160 MG tablet Take 1 tablet by mouth 2 (two) times daily. Start the day prior to foley removal appointment   No facility-administered encounter medications on file as of 06/09/2024.    Past Medical History:  Diagnosis Date   Arthritis    gout   Cancer (HCC)    Prostate   Diabetes mellitus without complication (HCC)    Hypertension     Past Surgical History:  Procedure Laterality Date   LYMPH NODE DISSECTION Bilateral 08/16/2022   Procedure: LYMPH NODE DISSECTION;  Surgeon: Alvaro Hummer, MD;  Location: [facility];  Service: Urology;  Laterality: Bilateral;   NO PAST SURGERIES     ROBOT ASSISTED LAPAROSCOPIC RADICAL PROSTATECTOMY N/A  08/16/2022   Procedure: XI ROBOTIC ASSISTED LAPAROSCOPIC RADICAL PROSTATECTOMY WITH INDOCYANINE GREEN DYE;  Surgeon: Alvaro Hummer, MD;  Location: [facility];  Service: Urology;  Laterality: N/A;    Family History  Problem Relation Age of Onset   Diabetes Mother    Cancer Father    Diabetes Father     Social History   Socioeconomic History   Marital status: Married    Spouse name: Not on file   Number of children: Not on file   Years of education: Not on file   Highest education level: 12th grade  Occupational History   Not on file  Tobacco Use   Smoking status: Never   Smokeless tobacco: Not on file  Vaping Use   Vaping status: Never Used  Substance and Sexual Activity   Alcohol use: No    Alcohol/week: 0.0 standard drinks of alcohol   Drug use: No   Sexual activity: Yes  Other Topics Concern   Not on file  Social History Narrative   Not on file   Social Drivers of Health   Financial Resource Strain: Low Risk  (06/06/2024)   Overall Financial Resource Strain (CARDIA)    Difficulty of Paying Living Expenses: Not hard at all  Food Insecurity: No Food Insecurity (06/06/2024)   Hunger Vital Sign    Worried About Running Out of Food in the Last Year: Never true    Ran Out of Food in the Last Year: Never true  Transportation Needs: No Transportation Needs (06/06/2024)   PRAPARE - Administrator, Civil Service (Medical): No    Lack of Transportation (Non-Medical): No  Physical Activity: Insufficiently Active (06/06/2024)   Exercise Vital Sign    Days of Exercise per Week: 2 days    Minutes of Exercise per Session: 20 min  Stress: No Stress Concern Present (06/06/2024)   [employer] of Occupational Health - Occupational Stress Questionnaire    Feeling of Stress: Only a little  Social Connections: Moderately Isolated (06/06/2024)   Social Connection and Isolation Panel    Frequency of Communication with Friends and Family: Once a week    Frequency of Social  Gatherings with Friends and Family: Once a week    Attends Religious Services: More than 4 times per year    Active  Member of Clubs or Organizations: No    Attends Engineer, structural: Not on file    Marital Status: Married  Intimate Partner Violence: Not At Risk (09/07/2022)   Received from [facility]   Humiliation, Afraid, Rape, and Kick questionnaire    Within the last year, have you been afraid of your partner or ex-partner?: No    Within the last year, have you been humiliated or emotionally abused in other ways by your partner or ex-partner?: No    Within the last year, have you been kicked, hit, slapped, or otherwise physically hurt by your partner or ex-partner?: No    Within the last year, have you been raped or forced to have any kind of sexual activity by your partner or ex-partner?: No    Review of Systems  Constitutional:  Negative for chills and fever.  HENT:  Negative for congestion and sore throat.   Respiratory:  Negative for cough, shortness of breath and wheezing.   Cardiovascular:  Negative for chest pain.  Gastrointestinal:  Negative for melena, nausea and vomiting.  Musculoskeletal:  Positive for joint pain. Negative for falls.       Right shoulder pain 7/10 sharp/aching pain, getting worse with lifting pulling twisting  Skin:  Negative for itching and rash.  Neurological:  Negative for dizziness and headaches.   Negative unless indicated in HPI    Objective   BP 128/73   Pulse 64   Temp 98.7 F (37.1 C) (Temporal)   Ht 5' 6 (1.676 m)   Wt 220 lb 3.2 oz (99.9 kg)   SpO2 97%   BMI 35.54 kg/m   Physical Exam Vitals and nursing note reviewed.  Constitutional:      General: He is not in acute distress. HENT:     Head: Normocephalic and atraumatic.     Right Ear: Tympanic membrane, ear canal and external ear normal. There is no impacted cerumen.     Left Ear: Tympanic membrane, ear canal and external ear normal. There is no impacted  cerumen.     Nose: Nose normal.     Mouth/Throat:     Mouth: Mucous membranes are moist.  Eyes:     Extraocular Movements: Extraocular movements intact.     Conjunctiva/sclera: Conjunctivae normal.     Pupils: Pupils are equal, round, and reactive to light.  Neck:     Vascular: No carotid bruit.  Cardiovascular:     Heart sounds: Normal heart sounds.  Pulmonary:     Effort: Pulmonary effort is normal.     Breath sounds: Normal breath sounds.  Abdominal:     General: Bowel sounds are normal.     Palpations: Abdomen is soft.  Musculoskeletal:     Right shoulder: Tenderness present. No bony tenderness or crepitus. Decreased range of motion.     Left shoulder: Normal.     Cervical back: Normal range of motion and neck supple. No rigidity or tenderness.     Right lower leg: No edema.     Left lower leg: No edema.  Lymphadenopathy:     Cervical: No cervical adenopathy.  Skin:    General: Skin is warm and dry.     Findings: No rash.  Neurological:     Mental Status: He is alert and oriented to person, place, and time.     Gait: Gait is intact.  Psychiatric:        Mood and Affect: Mood normal.  Behavior: Behavior normal.        Thought Content: Thought content normal.        Judgment: Judgment normal.     Last CBC Lab Results  Component Value Date   WBC 5.8 08/07/2022   HGB 10.9 (L) 08/17/2022   HCT 34.3 (L) 08/17/2022   MCV 89.3 08/07/2022   MCH 28.8 08/07/2022   RDW 12.3 08/07/2022   PLT 365 08/07/2022   Last metabolic panel Lab Results  Component Value Date   GLUCOSE 186 (H) 08/17/2022   NA 135 08/17/2022   K 4.4 08/17/2022   CL 104 08/17/2022   CO2 25 08/17/2022   BUN 15 08/17/2022   CREATININE 1.16 08/17/2022   GFRNONAA >60 08/17/2022   CALCIUM  8.4 (L) 08/17/2022   ANIONGAP 6 08/17/2022   Last hemoglobin A1c Lab Results  Component Value Date   HGBA1C 7.0 (H) 08/07/2022       Assessment & Plan:  Diabetes mellitus without complication  (HCC) -     Comprehensive metabolic panel with GFR -     Thyroid  Panel With TSH -     Bayer DCA Hb A1c Waived -     Microalbumin / creatinine urine ratio -     Semaglutide (0.25 or 0.5MG /DOS); Inject 0.5 mg into the skin every 7 (seven) days.  Dispense: 3 mL; Refill: 0 -     metFORMIN  HCl; Take 1 tablet (1,000 mg total) by mouth 2 (two) times daily with a meal.  Dispense: 180 tablet; Refill: 3  Acute pain of right shoulder -     DG Shoulder Right -     predniSONE ; Take 1 tablet (10 mg total) by mouth daily with breakfast.  Dispense: 10 tablet; Refill: 0 -     tiZANidine  HCl; Take 1 capsule (2 mg total) by mouth 3 (three) times daily as needed for muscle spasms.  Dispense: 30 capsule; Refill: 0  Primary hypertension -     Comprehensive metabolic panel with GFR  Moderate mixed hyperlipidemia not requiring statin therapy -     Lipid panel -     Atorvastatin  Calcium ; Take 1 tablet (10 mg total) by mouth at bedtime.  Dispense: 90 tablet; Refill: 0  Class 2 obesity without serious comorbidity with body mass index (BMI) of 35.0 to 35.9 in adult, unspecified obesity type -     Semaglutide (0.25 or 0.5MG /DOS); Inject 0.5 mg into the skin every 7 (seven) days.  Dispense: 3 mL; Refill: 0 -     metFORMIN  HCl; Take 1 tablet (1,000 mg total) by mouth 2 (two) times daily with a meal.  Dispense: 180 tablet; Refill: 3  Prostate cancer (HCC) -     PSA, total and free  Screening PSA (prostate specific antigen) -     PSA, total and free  Nocturia -     PSA, total and free  Encounter for general adult medical examination with abnormal findings -     CBC with Differential/Platelet -     Comprehensive metabolic panel with GFR -     Thyroid  Panel With TSH -     Lipid panel -     Bayer DCA Hb A1c Waived -     HepB+HepC+HIV Panel  Elevated PSA  Andres Carter is a 53 year old African-American male seen today to establish care, no acute distress Shoulder pain: Prednisone  10 mg twice daily for 5 days,  Zanaflex  2 mg 3 times daily as needed, light duty work note provided right Diabetes: Increase metformin  to 1000  mg twice daily and restart Ozempic  0.5 mg weekly, heart healthy diet avoid carbs, microalbumin ordered result pending diabetic foot exam completed Hypertension: Restart lisinopril  5 mg daily Hyperlipidemia: Restart Lipitor 10 mg daily Health maintenance not at goal client need to schedule diabetic eye exam Lab: CBC, CMP, lipid, TSH, PSA, microalbumin result pending  Encourage healthy lifestyle choices, including diet (rich in fruits, vegetables, and lean proteins, and low in salt and simple carbohydrates) and exercise (at least 30 minutes of moderate physical activity daily).     The above assessment and management plan was discussed with the patient. The patient verbalized understanding of and has agreed to the management plan. Patient is aware to call the clinic if they develop any new symptoms or if symptoms persist or worsen. Patient is aware when to return to the clinic for a follow-up visit. Patient educated on when it is appropriate to go to the emergency department.  Return in about 3 months (around 09/09/2024) for chronic diseases management.   Andres Osterman [place], DNP [facility] [address] [place], [place] [address] [phone]  Note: This document was prepared by [employer] voice dictation technology and any errors that results from this process are unintentional.

## 2024-06-09 NOTE — Progress Notes (Unsigned)
 Arrived on 06/09/2024 and has given verbal consent to obtain images and complete their overdue diabetic retinal screening.  The images have been sent to an ophthalmologist or optometrist for review and interpretation.  Results will be sent back to [facility] for review.  Patient has been informed they will be contacted when we receive the results via telephone or MyChart.  Patient confirms he does not currently have a regular eye doctor and has never been to see an eye doctor.  Patient denies any noticeable issues with his eyes other than having to wear readers to read up close and sometimes there seems to be a haze over his eyes that come and go.

## 2024-06-10 LAB — MICROALBUMIN / CREATININE URINE RATIO
Creatinine, Urine: 94.4 mg/dL
Microalb/Creat Ratio: 87 mg/g{creat} — ABNORMAL HIGH (ref 0–29)
Microalbumin, Urine: 82.2 ug/mL

## 2024-06-10 LAB — CBC WITH DIFFERENTIAL/PLATELET
Basophils Absolute: 0 x10E3/uL (ref 0.0–0.2)
Basos: 0 %
EOS (ABSOLUTE): 0.3 x10E3/uL (ref 0.0–0.4)
Eos: 5 %
Hematocrit: 45.7 % (ref 37.5–51.0)
Hemoglobin: 14.7 g/dL (ref 13.0–17.7)
Immature Grans (Abs): 0 x10E3/uL (ref 0.0–0.1)
Immature Granulocytes: 0 %
Lymphocytes Absolute: 2.3 x10E3/uL (ref 0.7–3.1)
Lymphs: 41 %
MCH: 28.6 pg (ref 26.6–33.0)
MCHC: 32.2 g/dL (ref 31.5–35.7)
MCV: 89 fL (ref 79–97)
Monocytes Absolute: 0.6 x10E3/uL (ref 0.1–0.9)
Monocytes: 11 %
Neutrophils Absolute: 2.4 x10E3/uL (ref 1.4–7.0)
Neutrophils: 43 %
Platelets: 313 x10E3/uL (ref 150–450)
RBC: 5.14 x10E6/uL (ref 4.14–5.80)
RDW: 11.3 % — ABNORMAL LOW (ref 11.6–15.4)
WBC: 5.5 x10E3/uL (ref 3.4–10.8)

## 2024-06-10 LAB — HEPB+HEPC+HIV PANEL
HIV Screen 4th Generation wRfx: NONREACTIVE
Hep B C IgM: NEGATIVE
Hep B Core Total Ab: NEGATIVE
Hep B E Ab: NONREACTIVE
Hep B E Ag: NEGATIVE
Hep B Surface Ab, Qual: NONREACTIVE
Hep C Virus Ab: NONREACTIVE
Hepatitis B Surface Ag: NEGATIVE

## 2024-06-10 LAB — PSA, TOTAL AND FREE
PSA, Free: <0.02 ng/mL
Prostate Specific Ag, Serum: <0.1 ng/mL (ref 0.0–4.0)

## 2024-06-10 LAB — COMPREHENSIVE METABOLIC PANEL WITH GFR
ALT: 27 IU/L (ref 0–44)
AST: 20 IU/L (ref 0–40)
Albumin: 4.4 g/dL (ref 3.8–4.9)
Alkaline Phosphatase: 140 IU/L — AB (ref 44–121)
BUN/Creatinine Ratio: 11 (ref 9–20)
BUN: 14 mg/dL (ref 6–24)
Bilirubin Total: 0.3 mg/dL (ref 0.0–1.2)
CO2: 23 mmol/L (ref 20–29)
Calcium: 9.8 mg/dL (ref 8.7–10.2)
Chloride: 97 mmol/L (ref 96–106)
Creatinine, Ser: 1.29 mg/dL — AB (ref 0.76–1.27)
Globulin, Total: 3.2 g/dL (ref 1.5–4.5)
Glucose: 323 mg/dL — AB (ref 70–99)
Potassium: 4.3 mmol/L (ref 3.5–5.2)
Sodium: 135 mmol/L (ref 134–144)
Total Protein: 7.6 g/dL (ref 6.0–8.5)
eGFR: 67 mL/min/1.73 (ref >59)

## 2024-06-10 LAB — LIPID PANEL
Cholesterol, Total: 266 mg/dL — AB (ref 100–199)
HDL: 84 mg/dL (ref >39)
LDL CALC COMMENT:: 3.2 ratio (ref 0.0–5.0)
LDL Chol Calc (NIH): 159 mg/dL — AB (ref 0–99)
Triglycerides: 135 mg/dL (ref 0–149)
VLDL Cholesterol Cal: 23 mg/dL (ref 5–40)

## 2024-06-10 LAB — THYROID PANEL WITH TSH
Free Thyroxine Index: 2.2 (ref 1.2–4.9)
T3 Uptake Ratio: 26 (ref 24–39)
T4, Total: 8.3 ug/dL (ref 4.5–12.0)
TSH: 1.52 u[IU]/mL (ref 0.450–4.500)

## 2024-06-10 MED ORDER — DAPAGLIFLOZIN PROPANEDIOL 5 MG PO TABS
5.0000 mg | ORAL_TABLET | Freq: Every day | ORAL | 0 refills | Status: DC
Start: 2024-06-10 — End: 2024-09-10

## 2024-06-10 MED ORDER — ATORVASTATIN CALCIUM 20 MG PO TABS: 20.0000 mg | ORAL_TABLET | Freq: Every day | ORAL | 0 refills | Status: DC

## 2024-06-30 ENCOUNTER — Ambulatory Visit: Payer: Self-pay | Admitting: Nurse Practitioner

## 2024-08-20 ENCOUNTER — Other Ambulatory Visit: Payer: Self-pay | Admitting: Nurse Practitioner

## 2024-08-20 DIAGNOSIS — E782 Mixed hyperlipidemia: Secondary | ICD-10-CM

## 2024-09-09 NOTE — Progress Notes (Signed)
 "    Subjective:  Patient ID: Andres Carter, male    DOB: [birth year], 53 y.o.   MRN: [id number]  Patient Care Team: Deitra Morton Carter, Nena, NP as PCP - General (Nurse Practitioner) Sherrilee Belvie LITTIE, MD as Consulting Physician (Urology)   Chief Complaint:  Medical Management of Chronic Issues (3 month )   HPI: Andres Carter is a 53 y.o. male presenting on 09/11/2024 for Medical Management of Chronic Issues (3 month )   Discussed the use of AI scribe software for clinical note transcription with the patient, who gave verbal consent to proceed.  History of Present Illness Andres Carter is a 54 year old male who presents with right shoulder pain.  He has been experiencing right shoulder and arm pain for approximately four months, which has progressively worsened. The pain disrupts his sleep and limits his ability to raise his arm or move it side to side without discomfort. 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  He previously received a steroid treatment and an injection in the hip, neither of which alleviated the shoulder pain. Over-the-counter pain medications like ibuprofen and a muscle relaxer have not provided significant relief. An x-ray was performed on his right shoulder at his last visit.  He manages diabetes with metformin  and Farxiga  but stopped Ozempic  due to nausea and vomiting. He experienced 'nasty burps' and vomiting after taking Ozempic , which he previously managed with Zofran . He has been inconsistent with his medication regimen due to forgetfulness and has not been using the [facility] for glucose monitoring.  His blood pressure remains high despite taking lisinopril  5 mg, and he has not significantly reduced salt intake. He has not been compliant with taking Lipitor regularly. He reports not using his inhaler frequently.      Relevant past medical, surgical, family,  and social history reviewed and updated as indicated.  Allergies and medications reviewed and updated. Data reviewed: Chart in Epic.   Past Medical History:  Diagnosis Date   Arthritis    gout   Cancer (HCC)    Prostate   Diabetes mellitus without complication (HCC)    Hypertension     Past Surgical History:  Procedure Laterality Date   LYMPH NODE DISSECTION Bilateral 08/16/2022   Procedure: LYMPH NODE DISSECTION;  Surgeon: Andres Hummer, MD;  Location: [facility];  Service: Urology;  Laterality: Bilateral;   NO PAST SURGERIES     ROBOT ASSISTED LAPAROSCOPIC RADICAL PROSTATECTOMY N/A 08/16/2022   Procedure: XI ROBOTIC ASSISTED LAPAROSCOPIC RADICAL PROSTATECTOMY WITH INDOCYANINE GREEN DYE;  Surgeon: Andres Hummer, MD;  Location: [facility];  Service: Urology;  Laterality: N/A;    Social History   Socioeconomic History   Marital status: Married    Spouse name: Not on file   Number of children: Not on file   Years of education: Not on file   Highest education level: 12th grade  Occupational History   Not on file  Tobacco Use   Smoking status: Never   Smokeless tobacco: Not on file  Vaping Use   Vaping status: Never Used  Substance and Sexual Activity   Alcohol use: No    Alcohol/week: 0.0 standard drinks of alcohol   Drug use: No   Sexual activity: Yes  Other Topics Concern   Not on file  Social History Narrative   Not on file   Social Drivers of Health   Financial Resource Strain: Low Risk  (  09/07/2024)   Overall Financial Resource Strain (CARDIA)    Difficulty of Paying Living Expenses: Not very hard  Food Insecurity: No Food Insecurity (09/07/2024)   Hunger Vital Sign    Worried About Running Out of Food in the Last Year: Never true    Ran Out of Food in the Last Year: Never true  Transportation Needs: No Transportation Needs (09/07/2024)   PRAPARE - Administrator, Civil Service (Medical): No    Lack of Transportation (Non-Medical): No  Physical  Activity: Inactive (09/07/2024)   Exercise Vital Sign    Days of Exercise per Week: 0 days    Minutes of Exercise per Session: Not on file  Stress: Stress Concern Present (09/07/2024)   [employer] of Occupational Health - Occupational Stress Questionnaire    Feeling of Stress: Rather much  Social Connections: Moderately Isolated (09/07/2024)   Social Connection and Isolation Panel    Frequency of Communication with Friends and Family: Once a week    Frequency of Social Gatherings with Friends and Family: Once a week    Attends Religious Services: More than 4 times per year    Active Member of [employer] or Organizations: No    Attends Banker Meetings: Not on file    Marital Status: Married  Catering Manager Violence: Not At Risk (09/07/2022)   Received from [facility]   Humiliation, Afraid, Rape, and Kick questionnaire    Within the last year, have you been afraid of your partner or ex-partner?: No    Within the last year, have you been humiliated or emotionally abused in other ways by your partner or ex-partner?: No    Within the last year, have you been kicked, hit, slapped, or otherwise physically hurt by your partner or ex-partner?: No    Within the last year, have you been raped or forced to have any kind of sexual activity by your partner or ex-partner?: No    Outpatient Encounter Medications as of 09/11/2024  Medication Sig   albuterol  (VENTOLIN  HFA) 108 (90 Base) MCG/ACT inhaler Inhale 2 puffs into the lungs every 6 (six) hours as needed for wheezing or shortness of breath.   lisinopril  (ZESTRIL ) 10 MG tablet Take 1 tablet (10 mg total) by mouth daily.   naproxen  (NAPROSYN ) 500 MG tablet Take 1 tablet (500 mg total) by mouth 2 (two) times daily with a meal.   ondansetron  (ZOFRAN -ODT) 4 MG disintegrating tablet Take 1 tablet (4 mg total) by mouth every 8 (eight) hours as needed for nausea or vomiting.   Semaglutide ,0.25 or 0.5MG /DOS, 2 MG/3ML SOPN Inject 0.5  mg into the skin every 7 (seven) days.   [DISCONTINUED] atorvastatin  (LIPITOR) 20 MG tablet Take 1 tablet by mouth once daily   [DISCONTINUED] FARXIGA  5 MG TABS tablet TAKE 1 TABLET BY MOUTH ONCE DAILY BEFORE BREAKFAST   [DISCONTINUED] lisinopril  (ZESTRIL ) 5 MG tablet Take 5 mg by mouth daily.   [DISCONTINUED] metFORMIN  (GLUCOPHAGE ) 1000 MG tablet Take 1 tablet (1,000 mg total) by mouth 2 (two) times daily with a meal.   [DISCONTINUED] tizanidine  (ZANAFLEX ) 2 MG capsule Take 1 capsule (2 mg total) by mouth 3 (three) times daily as needed for muscle spasms.   atorvastatin  (LIPITOR) 20 MG tablet Take 1 tablet (20 mg total) by mouth daily.   dapagliflozin  propanediol (FARXIGA ) 5 MG TABS tablet Take 1 tablet (5 mg total) by mouth daily before breakfast.   metFORMIN  (GLUCOPHAGE ) 1000 MG tablet Take 1 tablet (1,000 mg  total) by mouth 2 (two) times daily with a meal.   tizanidine  (ZANAFLEX ) 2 MG capsule Take 1 capsule (2 mg total) by mouth 3 (three) times daily as needed for muscle spasms.   [DISCONTINUED] atorvastatin  (LIPITOR) 20 MG tablet Take 1 tablet (20 mg total) by mouth daily.   [DISCONTINUED] dapagliflozin  propanediol (FARXIGA ) 5 MG TABS tablet Take 1 tablet (5 mg total) by mouth daily before breakfast.   [DISCONTINUED] predniSONE  (DELTASONE ) 10 MG tablet Take 1 tablet (10 mg total) by mouth daily with breakfast. (Patient not taking: Reported on 09/11/2024)   [EXPIRED] ketorolac  (TORADOL ) 30 MG/ML injection 30 mg    No facility-administered encounter medications on file as of 09/11/2024.    No Known Allergies  Pertinent ROS per HPI, otherwise unremarkable      Objective:  BP 138/81   Pulse 82   Temp 98.1 F (36.7 C) (Temporal)   Ht 5' 6 (1.676 m)   Wt 223 lb 12.8 oz (101.5 kg)   SpO2 98%   BMI 36.12 kg/m    Wt Readings from Last 3 Encounters:  09/11/24 223 lb 12.8 oz (101.5 kg)  06/09/24 220 lb 3.2 oz (99.9 kg)  08/16/22 212 lb 15.4 oz (96.6 kg)   BP Readings from Last 3  Encounters:  09/11/24 138/81  06/09/24 128/73  04/30/23 110/73     Physical Exam Vitals and nursing note reviewed.  Constitutional:      Appearance: He is obese.  HENT:     Head: Normocephalic and atraumatic.     Nose: Nose normal.     Mouth/Throat:     Mouth: Mucous membranes are moist.  Eyes:     General: No scleral icterus.    Extraocular Movements: Extraocular movements intact.     Conjunctiva/sclera: Conjunctivae normal.     Pupils: Pupils are equal, round, and reactive to light.  Cardiovascular:     Heart sounds: Normal heart sounds.  Pulmonary:     Effort: Pulmonary effort is normal.     Breath sounds: Normal breath sounds.  Musculoskeletal:        General: Normal range of motion.     Right lower leg: No edema.     Left lower leg: No edema.  Skin:    General: Skin is warm and dry.     Findings: No rash.  Neurological:     Mental Status: He is alert and oriented to person, place, and time.  Psychiatric:        Mood and Affect: Mood normal.        Behavior: Behavior normal.        Thought Content: Thought content normal.        Judgment: Judgment normal.    Physical Exam VITALS: BP- 138/81 MUSCULOSKELETAL: Right shoulder pain on movement.     Results for orders placed or performed in visit on 06/27/24  [facility] DIABETES EYE EXAM   Collection Time: 06/09/24 11:17 AM  Result Value Ref Range   HM Diabetic Eye Exam No Retinopathy No Retinopathy       Pertinent labs & imaging results that were available during my care of the patient were reviewed by me and considered in my medical decision making.  Assessment & Plan:  Andres Carter was seen today for medical management of chronic issues.  Diagnoses and all orders for this visit:  Primary hypertension -     lisinopril  (ZESTRIL ) 10 MG tablet; Take 1 tablet (10 mg total) by mouth daily.  Type 2 diabetes  mellitus with hyperglycemia, without long-term current use of insulin  (HCC) -     Bayer DCA Hb A1c Waived -      metFORMIN  (GLUCOPHAGE ) 1000 MG tablet; Take 1 tablet (1,000 mg total) by mouth 2 (two) times daily with a meal. -     dapagliflozin  propanediol (FARXIGA ) 5 MG TABS tablet; Take 1 tablet (5 mg total) by mouth daily before breakfast. -     ondansetron  (ZOFRAN -ODT) 4 MG disintegrating tablet; Take 1 tablet (4 mg total) by mouth every 8 (eight) hours as needed for nausea or vomiting.  Moderate mixed hyperlipidemia not requiring statin therapy -     atorvastatin  (LIPITOR) 20 MG tablet; Take 1 tablet (20 mg total) by mouth daily.  Class 2 obesity without serious comorbidity with body mass index (BMI) of 35.0 to 35.9 in adult, unspecified obesity type -     metFORMIN  (GLUCOPHAGE ) 1000 MG tablet; Take 1 tablet (1,000 mg total) by mouth 2 (two) times daily with a meal.  Chronic right shoulder pain -     Ambulatory referral to Orthopedics -     naproxen  (NAPROSYN ) 500 MG tablet; Take 1 tablet (500 mg total) by mouth 2 (two) times daily with a meal. -     tizanidine  (ZANAFLEX ) 2 MG capsule; Take 1 capsule (2 mg total) by mouth 3 (three) times daily as needed for muscle spasms. -     ketorolac  (TORADOL ) 30 MG/ML injection 30 mg  Diabetes mellitus without complication (HCC) -     metFORMIN  (GLUCOPHAGE ) 1000 MG tablet; Take 1 tablet (1,000 mg total) by mouth 2 (two) times daily with a meal.  Acute pain of right shoulder -     tizanidine  (ZANAFLEX ) 2 MG capsule; Take 1 capsule (2 mg total) by mouth 3 (three) times daily as needed for muscle spasms.  Nausea and vomiting, unspecified vomiting type     Assessment and Plan Andres Carter 53 year old African-American male seen today for chronic disease management, no acute distress Assessment & Plan Right shoulder pain Chronic pain affecting sleep and work, unresponsive to previous treatments. - Refer to physical therapy for rehabilitation. - Refer to orthopedics for evaluation and potential MRI. - Prescribe naproxen  500 mg twice daily with food. -  Administer Toradol  injection for shoulder pain. - Refill Tizanidine  2 mg  Heat pad after work  Type 2 diabetes mellitus Improved A1c from 10.8 to 8.4. Non-compliance with Ozempic  due to side effects. - Restart Ozempic  with Zofran  for nausea management. - Maintain metformin  regimen. - Continue Farxiga  5 mg. - Educated on medication adherence and use of phone alarms.  Nausea and vomiting due to GLP-1 agonist therapy Side effects from Ozempic  initiation. - Prescribe Zofran  before Ozempic . - Advise continuation of Ozempic  for 30 days.  Class 2 obesity Potential benefit from Ozempic  for weight loss. - Encourage adherence to Ozempic  for weight management.  Primary hypertension Elevated BP, non-compliance with lisinopril . - Increase lisinopril  to 10 mg daily. - Educate on reducing salt intake and lifestyle changes.  Mixed hyperlipidemia Non-compliance with atorvastatin , risk of cardiovascular events. - Continue atorvastatin  20 mg daily. - Educate on importance of cholesterol medication.      Continue all other maintenance medications.  Follow up plan: Return in about 3 months (around 12/12/2024) for Chronic Diseases Management.   Continue healthy lifestyle choices, including diet (rich in fruits, vegetables, and lean proteins, and low in salt and simple carbohydrates) and exercise (at least 30 minutes of moderate physical activity daily).  Educational handout given for  Clinical References  Shoulder Pain Many things can cause shoulder pain, including: An injury. Moving the shoulder in the same way again and again (overuse). Joint pain (arthritis). Pain can come from: Swelling and irritation (inflammation) of any part of the shoulder. An injury to: The shoulder joint. Tissues that connect muscle to bone (tendons). Tissues that connect bones to each other (ligaments). Bones. Follow these instructions at home: Watch for changes in your symptoms. Let your doctor know  about them. Follow these instructions to help with your pain. If you have a sling that can be taken off: Wear the sling as told by your doctor. Take it off only as told by your doctor. Check the skin around the sling every day. Tell your doctor if you see problems. Loosen the sling if your fingers: Tingle. Become numb. Become cold. Keep the sling clean. If the sling is not waterproof: Do not let it get wet. Take the sling off when you shower or bathe. Managing pain, stiffness, and swelling  If told, put ice on the painful area. Put ice in a plastic bag. Place a towel between your skin and the bag. Leave the ice on for 20 minutes, 2-3 times a day. Stop putting ice on if it does not help with the pain. If your skin turns bright red, take off the ice right away to prevent skin damage. The risk of damage is higher if you cannot feel pain, heat, or cold. Squeeze a soft ball or a foam pad as much as possible. This prevents swelling in the shoulder. It also helps to strengthen the arm. General instructions Take over-the-counter and prescription medicines only as told by your doctor. Keep all follow-up visits. This will help you avoid any type of permanent shoulder problems. Contact a doctor if: Your pain gets worse. Medicine does not help your pain. You have new pain in your arm, hand, or fingers. You loosen your sling and your arm, hand, or fingers: Tingle. Are numb. Are swollen. Get help right away if: Your arm, hand, or fingers turn white or blue. This information is not intended to replace advice given to you by your health care provider. Make sure you discuss any questions you have with your health care provider. Document Revised: 06/02/2022 Document Reviewed: 06/02/2022 Elsevier Patient Education  2024 Elsevier Inc. Shoulder Pain Many things can cause shoulder pain, including: An injury to the shoulder. Overuse of the shoulder. Arthritis. The source of the pain can  be: Inflammation. An injury to the shoulder joint. An injury to a tendon, ligament, or bone. Follow these instructions at home: Pay attention to changes in your symptoms. Let your health care provider know about them. Follow these instructions to relieve your pain. If you have a removable sling: Wear the sling as told by your provider. Remove it only as told by your provider. Check the skin around the sling every day. Tell your provider about any concerns. Loosen the sling if your fingers tingle, become numb, or become cold. Keep the sling clean. If the sling is not waterproof: Do not let it get wet. Remove it to shower or bathe. Move your arm as little as possible, but keep your hand moving to prevent swelling. Managing pain, stiffness, and swelling  If told, put ice on the painful area. If you have a removable sling or immobilizer, remove it as told by your provider. Put ice in a plastic bag. Place a towel between your skin and the bag. Leave the ice on  for 20 minutes, 2-3 times a day. If your skin turns bright red, remove the ice right away to prevent skin damage. The risk of damage is higher if you cannot feel pain, heat, or cold. Move your fingers often to reduce stiffness and swelling. Squeeze a soft ball or a foam pad as much as possible. This helps to keep the shoulder from swelling. It also helps to strengthen the arm. General instructions Take over-the-counter and prescription medicines only as told by your provider. Exercise may help with pain management. Perform exercises if told by your provider. You may be referred to a physical therapist to help in your recovery process. Keep all follow-up visits in order to avoid any type of permanent shoulder disability or chronic pain problems. Contact a health care provider if: Your pain is not relieved with medicines. New pain develops in your arm, hand, or fingers. You loosen your sling and your arm, hand, or fingers remain  tingly, numb, swollen, or painful. Get help right away if: Your arm, hand, or fingers turn white or blue. This information is not intended to replace advice given to you by your health care provider. Make sure you discuss any questions you have with your health care provider. Document Revised: 06/02/2022 Document Reviewed: 06/02/2022 Elsevier Patient Education  2024 Elsevier Inc. BMI for Adults Body mass index (BMI) is a number found using a person's weight and height. BMI can help tell how much of a person's weight is made up of fat. BMI does not measure body fat directly. It is used instead of tests that directly measure body fat, which can be difficult and expensive. What are BMI measurements used for? BMI is useful to: Find out if your weight puts you at higher risk for medical problems. Help recommend changes, such as in diet and exercise. This can help you reach a healthy weight. BMI screening can be done again to see if these changes are working. How is BMI calculated? Your height and weight are measured. The BMI is found from those numbers. This can be done with U[place] or metric measurements. Note that charts and online BMI calculators are available to help you find your BMI quickly and easily without doing these calculations. To calculate your BMI in [place] measurements: Measure your weight in pounds (lb). Multiply the number of pounds by 703. So, for an adult who weighs 150 lb, multiply that number by 703: 150 x 703, which equals 105,450. Measure your height in inches. Then multiply that number by itself to get a measurement called inches squared. So, for an adult who is 70 inches tall, the inches squared measurement is 70 inches x 70 inches, which equals 4,900 inches squared. Divide the total from step 2 (number of lb x 703) by the total from step 3 (inches squared): 105,450  4,900 = 21.5. This is your BMI. To calculate your BMI in metric measurements:  Measure your weight in  kilograms (kg). For this example, the weight is 70 kg. Measure your height in meters (m). Then multiply that number by itself to get a measurement called meters squared. So, for an adult who is 1.75 m tall, the meters squared measurement is 1.75 m x 1.75 m, which equals 3.1 meters squared. Divide the number of kilograms (your weight) by the meters squared number. In this example: 70  3.1 = 22.6. This is your BMI. What do the results mean? BMI charts are used to see if you are underweight, normal weight, overweight, or  obese. The following guidelines will be used: Underweight: BMI less than 18.5. Normal weight: BMI between 18.5 and 24.9. Overweight: BMI between 25 and 29.9. Obese: BMI of 30 or above. BMI is a tool and cannot diagnose a condition. Talk with your health care provider about what your BMI means for you. Keep these notes in mind: Weight includes fat and muscle. Someone with a muscular build, such as an athlete, may have a BMI that is higher than 24.9. In cases like these, BMI is not a correct measure of body fat. If you have a BMI of 25 or higher, your provider may need to do more testing to find out if excess body fat is the cause. BMI is measured the same way for males and females. Females usually have more body fat than males of the same height and weight. Where to find more information For more information about BMI, including tools to quickly find your BMI, go to: Centers for Disease Control and Prevention: tonerpromos.no American Heart Association: heart.org National Heart, Lung, and Blood Institute: buffalodrycleaner.gl This information is not intended to replace advice given to you by your health care provider. Make sure you discuss any questions you have with your health care provider. Document Revised: 07/20/2022 Document Reviewed: 07/13/2022 Elsevier Patient Education  2024 Elsevier Inc. Obesity, Adult Obesity is the condition of having too much total body fat. Being overweight  or obese means that your weight is greater than what is considered healthy for your body size. Obesity is determined by a measurement called BMI (body mass index). BMI is an estimate of body fat and is calculated from height and weight. For adults, a BMI of 30 or higher is considered obese. Obesity can lead to other health concerns and major illnesses, including: Stroke. Coronary artery disease (CAD). Type 2 diabetes. Some types of cancer, including cancers of the colon, breast, uterus, and gallbladder. High blood pressure (hypertension). High cholesterol. Gallbladder stones. Obesity can also contribute to: Osteoarthritis. Sleep apnea. Infertility problems. What are the causes? Common causes of this condition include: Eating daily meals that are high in calories, sugar, and fat. Drinking high amounts of sugar-sweetened beverages, such as soft drinks. Being born with genes that may make you more likely to become obese. Having a medical condition that causes obesity, including: Hypothyroidism. Polycystic ovarian syndrome (PCOS). Binge-eating disorder. Cushing syndrome. Taking certain medicines, such as steroids, antidepressants, and seizure medicines. Not being physically active (sedentary lifestyle). Not getting enough sleep. What increases the risk? The following factors may make you more likely to develop this condition: Having a family history of obesity. Living in an area with limited access to: [place], recreation centers, or sidewalks. Healthy food choices, such as grocery stores and farmers' markets. What are the signs or symptoms? The main sign of this condition is having too much body fat. How is this diagnosed? This condition is diagnosed based on: Your BMI. If you are an adult with a BMI of 30 or higher, you are considered obese. Your waist circumference. This measures the distance around your waistline. Your skinfold thickness. Your health care provider may gently pinch  a fold of your skin and measure it. You may have other tests to check for underlying conditions. How is this treated? Treatment for this condition often includes changing your lifestyle. Treatment may include some or all of the following: Dietary changes. This may include developing a healthy meal plan. Regular physical activity. This may include activity that causes your heart to beat faster (  aerobic exercise) and strength training. Work with your health care provider to design an exercise program that works for you. Medicine to help you lose weight if you are unable to lose one pound a week after six weeks of healthy eating and more physical activity. Treating conditions that cause the obesity (underlying conditions). Surgery. Surgical options may include gastric banding and gastric bypass. Surgery may be done if: Other treatments have not helped to improve your condition. You have a BMI of 40 or higher. You have life-threatening health problems related to obesity. Follow these instructions at home: Eating and drinking  Follow recommendations from your health care provider about what you eat and drink. Your health care provider may advise you to: Limit fast food, sweets, and processed snack foods. Choose low-fat options, such as low-fat milk instead of whole milk. Eat five or more servings of fruits or vegetables every day. Choose healthy foods when you eat out. Keep low-fat snacks available. Limit sugary drinks, such as soda, fruit juice, sweetened iced tea, and flavored milk. Drink enough water to keep your urine pale yellow. Do not follow a fad diet. Fad diets can be unhealthy and even dangerous. Other healthful choices include: Eat at home more often. This gives you more control over what you eat. Learn to read food labels. This will help you understand how much food is considered one serving. Learn what a healthy serving size is. Physical activity Exercise regularly, as told by  your health care provider. Most adults should get up to 150 minutes of moderate-intensity exercise every week. Ask your health care provider what types of exercise are safe for you and how often you should exercise. Warm up and stretch before being active. Cool down and stretch after being active. Rest between periods of activity. Lifestyle Work with your health care provider and a dietitian to set a weight-loss goal that is healthy and reasonable for you. Limit your screen time. Find ways to reward yourself that do not involve food. Do not drink alcohol if: Your health care provider tells you not to drink. You are pregnant, may be pregnant, or are planning to become pregnant. If you drink alcohol: Limit how much you have to: 0-1 drink a day for women. 0-2 drinks a day for men. Know how much alcohol is in your drink. In the [place] one drink equals one 12 oz bottle of beer (355 mL), one 5 oz glass of wine (148 mL), or one 1 oz glass of hard liquor (44 mL). General instructions Keep a weight-loss journal to keep track of the food you eat and how much exercise you get. Take over-the-counter and prescription medicines only as told by your health care provider. Take vitamins and supplements only as told by your health care provider. Consider joining a support group. Your health care provider may be able to recommend a support group. Pay attention to your mental health as obesity can lead to depression or self esteem issues. Keep all follow-up visits. This is important. Contact a health care provider if: You are unable to meet your weight-loss goal after six weeks of dietary and lifestyle changes. You have trouble breathing. Summary Obesity is the condition of having too much total body fat. Being overweight or obese means that your weight is greater than what is considered healthy for your body size. Work with your health care provider and a dietitian to set a weight-loss goal that is  healthy and reasonable for you. Exercise regularly, as told by  your health care provider. Ask your health care provider what types of exercise are safe for you and how often you should exercise. This information is not intended to replace advice given to you by your health care provider. Make sure you discuss any questions you have with your health care provider. Document Revised: 06/07/2021 Document Reviewed: 06/07/2021 Elsevier Patient Education  2024 [employer] Dyslipidemia Dyslipidemia is an imbalance of waxy, fat-like substances (lipids) in the blood. The body needs lipids in small amounts. Dyslipidemia often involves a high level of cholesterol or triglycerides, which are types of lipids. Common forms of dyslipidemia include: High levels of LDL cholesterol. LDL is the type of cholesterol that causes fatty deposits (plaques) to build up in the blood vessels that carry blood away from the heart (arteries). Low levels of HDL cholesterol. HDL cholesterol is the type of cholesterol that protects against heart disease. High levels of HDL remove the LDL buildup from arteries. High levels of triglycerides. Triglycerides are a fatty substance in the blood that is linked to a buildup of plaques in the arteries. What are the causes? There are two main types of dyslipidemia: primary and secondary. Primary dyslipidemia is caused by changes (mutations) in genes that are passed down through families (inherited). These mutations cause several types of dyslipidemia. Secondary dyslipidemia may be caused by various risk factors that can lead to the disease, such as lifestyle choices and certain medical conditions. What increases the risk? You are more likely to develop this condition if you are an older man or if you are a woman who has gone through menopause. Other risk factors include: Having a family history of dyslipidemia. Taking certain medicines, including birth control pills, steroids, some  diuretics, and beta-blockers. Eating a diet high in saturated fat. Smoking cigarettes or excessive alcohol intake. Having certain medical conditions such as diabetes, polycystic ovary syndrome (PCOS), kidney disease, liver disease, or hypothyroidism. Not exercising regularly. Being overweight or obese with too much belly fat. What are the signs or symptoms? In most cases, dyslipidemia does not usually cause any symptoms. In severe cases, very high lipid levels can cause: Fatty bumps under the skin (xanthomas). A white or gray ring around the black center (pupil) of the eye. Very high triglyceride levels can cause inflammation of the pancreas (pancreatitis). How is this diagnosed? Your health care provider may diagnose dyslipidemia based on a routine blood test (fasting blood test). Because most people do not have symptoms of the condition, this blood testing (lipid profile) is done on adults age 92 and older and is repeated every 4-6 years. This test checks: Total cholesterol. This measures the total amount of cholesterol in your blood, including LDL cholesterol, HDL cholesterol, and triglycerides. A healthy number is below 200 mg/dL (4.82 mmol/L). LDL cholesterol. The target number for LDL cholesterol is different for each person, depending on individual risk factors. A healthy number is usually below 100 mg/dL (7.40 mmol/L). Ask your health care provider what your LDL cholesterol should be. HDL cholesterol. An HDL level of 60 mg/dL (8.44 mmol/L) or higher is best because it helps to protect against heart disease. A number below 40 mg/dL (8.96 mmol/L) for men or below 50 mg/dL (8.70 mmol/L) for women increases the risk for heart disease. Triglycerides. A healthy triglyceride number is below 150 mg/dL (8.30 mmol/L). If your lipid profile is abnormal, your health care provider may do other blood tests. How is this treated? Treatment depends on the type of dyslipidemia that you have and  your other  risk factors for heart disease and stroke. Your health care provider will have a target range for your lipid levels based on this information. Treatment for dyslipidemia starts with lifestyle changes, such as diet and exercise. Your health care provider may recommend that you: Get regular exercise. Make changes to your diet. Quit smoking if you smoke. Limit your alcohol intake. If diet changes and exercise do not help you reach your goals, your health care provider may also prescribe medicine to lower lipids. The most commonly prescribed type of medicine lowers your LDL cholesterol (statin drug). If you have a high triglyceride level, your provider may prescribe another type of drug (fibrate) or an omega-3 fish oil supplement, or both. Follow these instructions at home: Eating and drinking  Follow instructions from your health care provider or dietitian about eating or drinking restrictions. Eat a healthy diet as told by your health care provider. This can help you reach and maintain a healthy weight, lower your LDL cholesterol, and raise your HDL cholesterol. This may include: Limiting your calories, if you are overweight. Eating more fruits, vegetables, whole grains, fish, and lean meats. Limiting saturated fat, trans fat, and cholesterol. Do not drink alcohol if: Your health care provider tells you not to drink. You are pregnant, may be pregnant, or are planning to become pregnant. If you drink alcohol: Limit how much you have to: 0-1 drink a day for women. 0-2 drinks a day for men. Know how much alcohol is in your drink. In the [place] one drink equals one 12 oz bottle of beer (355 mL), one 5 oz glass of wine (148 mL), or one 1 oz glass of hard liquor (44 mL). Activity Get regular exercise. Start an exercise and strength training program as told by your health care provider. Ask your health care provider what activities are safe for you. Your health care provider may recommend: 30 minutes  of aerobic activity 4-6 days a week. Brisk walking is an example of aerobic activity. Strength training 2 days a week. General instructions Do not use any products that contain nicotine or tobacco. These products include cigarettes, chewing tobacco, and vaping devices, such as e-cigarettes. If you need help quitting, ask your health care provider. Take over-the-counter and prescription medicines only as told by your health care provider. This includes supplements. Keep all follow-up visits. This is important. Contact a health care provider if: You are having trouble sticking to your exercise or diet plan. You are struggling to quit smoking or to control your use of alcohol. Summary Dyslipidemia often involves a high level of cholesterol or triglycerides, which are types of lipids. Treatment depends on the type of dyslipidemia that you have and your other risk factors for heart disease and stroke. Treatment for dyslipidemia starts with lifestyle changes, such as diet and exercise. Your health care provider may prescribe medicine to lower lipids. This information is not intended to replace advice given to you by your health care provider. Make sure you discuss any questions you have with your health care provider. Document Revised: 06/02/2022 Document Reviewed: 01/03/2021 Elsevier Patient Education  2025 [employer] Preventing High Cholesterol Cholesterol is a white, waxy substance similar to fat that the human body needs to help build cells. The liver makes all the cholesterol that a person's body needs. Having high cholesterol (hypercholesterolemia) increases your risk for heart disease and stroke. Extra or excess cholesterol comes from the food that you eat. High cholesterol can often be prevented with  diet and lifestyle changes. If you already have high cholesterol, you can control it with diet, lifestyle changes, and medicines. How can high cholesterol affect me? If you have high  cholesterol, fatty deposits (plaques) may build up on the walls of your blood vessels. The blood vessels that carry blood away from your heart are called arteries. Plaques make the arteries narrower and stiffer. This in turn can: Restrict or block blood flow and cause blood clots to form. Increase your risk for heart attack and stroke. What can increase my risk for high cholesterol? This condition is more likely to develop in people who: Eat foods that are high in saturated fat or cholesterol. Saturated fat is mostly found in foods that come from animal sources. Are overweight. Are not getting enough exercise. Use products that contain nicotine or tobacco, such as cigarettes, e-cigarettes, and chewing tobacco. Have a family history of high cholesterol (familial hypercholesterolemia). What actions can I take to prevent this? Nutrition  Eat less saturated fat. Avoid trans fats (partially hydrogenated oils). These are often found in margarine and in some baked goods, fried foods, and snacks bought in packages. Avoid precooked or cured meat, such as bacon, sausages, or meat loaves. Avoid foods and drinks that have added sugars. Eat more fruits, vegetables, and whole grains. Choose healthy sources of protein, such as fish, poultry, lean cuts of red meat, beans, peas, lentils, and nuts. Choose healthy sources of fat, such as: Nuts. Vegetable oils, especially olive oil. Fish that have healthy fats, such as omega-3 fatty acids. These fish include mackerel or salmon. Lifestyle Lose weight if you are overweight. Maintaining a healthy body mass index (BMI) can help prevent or control high cholesterol. It can also lower your risk for diabetes and high blood pressure. Ask your health care provider to help you with a diet and exercise plan to lose weight safely. Do not use any products that contain nicotine or tobacco. These products include cigarettes, chewing tobacco, and vaping devices, such as  e-cigarettes. If you need help quitting, ask your health care provider. Alcohol use Do not drink alcohol if: Your health care provider tells you not to drink. You are pregnant, may be pregnant, or are planning to become pregnant. If you drink alcohol: Limit how much you have to: 0-1 drink a day for women. 0-2 drinks a day for men. Know how much alcohol is in your drink. In the [place] one drink equals one 12 oz bottle of beer (355 mL), one 5 oz glass of wine (148 mL), or one 1 oz glass of hard liquor (44 mL). Activity  Get enough exercise. Do exercises as told by your health care provider. Each week, do at least 150 minutes of exercise that takes a medium level of effort (moderate-intensity exercise). This kind of exercise: Makes your heart beat faster while allowing you to still be able to talk. Can be done in short sessions several times a day or longer sessions a few times a week. For example, on 5 days each week, you could walk fast or ride your bike 3 times a day for 10 minutes each time. Medicines Your health care provider may recommend medicines to help lower cholesterol. This may be a medicine to lower the amount of cholesterol that your liver makes. You may need medicine if: Diet and lifestyle changes have not lowered your cholesterol enough. You have high cholesterol and other risk factors for heart disease or stroke. Take over-the-counter and prescription medicines only as told  by your health care provider. General information Manage your risk factors for high cholesterol. Talk with your health care provider about all your risk factors and how to lower your risk. Manage other conditions that you have, such as diabetes or high blood pressure (hypertension). Have blood tests to check your cholesterol levels at regular points in time as told by your health care provider. Keep all follow-up visits. This is important. Where to find more information American Heart Association:  www.heart.org National Heart, Lung, and Blood Institute: popsteam.is Summary High cholesterol increases your risk for heart disease and stroke. By keeping your cholesterol level low, you can reduce your risk for these conditions. High cholesterol can often be prevented with diet and lifestyle changes. Work with your health care provider to manage your risk factors, and have your blood tested regularly. This information is not intended to replace advice given to you by your health care provider. Make sure you discuss any questions you have with your health care provider. Document Revised: 06/02/2022 Document Reviewed: 01/03/2021 Elsevier Patient Education  2024 [employer]. Nausea and Vomiting, Adult Nausea is feeling that you have an upset stomach and that you are about to vomit. Vomiting is when food in your stomach forcefully comes out of your mouth. Vomiting can make you feel weak. If you vomit, or if you are not able to drink enough fluids, you may not have enough water in your body (get dehydrated). If you do not have enough water in your body, you may: Feel tired. Feel thirsty. Have a dry mouth. Have cracked lips. Pee (urinate) less often. Older adults and people with other diseases or a weak body defense system (immune system) are at higher risk for not having enough water in the body. If you feel like you may vomit or you vomit, it is important to follow instructions from your doctor about how to take care of yourself. Follow these instructions at home: Watch your symptoms for any changes. Tell your doctor about them. Eating and drinking     Take an ORS (oral rehydration solution). This is a drink that is sold at pharmacies and stores. Drink clear fluids in small amounts as you are able, such as: Water. Ice chips. Fruit juice that has water added (diluted fruit juice). Low-calorie sports drinks. Eat bland, easy-to-digest foods in small amounts as you are able, such  as: Bananas. Applesauce. Rice. Low-fat (lean) meats. Toast. Crackers. Avoid drinking fluids that have a lot of sugar or caffeine in them. This includes energy drinks, sports drinks, and soda. Avoid alcohol. Avoid spicy or fatty foods. General instructions Take over-the-counter and prescription medicines only as told by your doctor. Drink enough fluid to keep your pee (urine) pale yellow. Wash your hands often with soap and water for at least 20 seconds. If you cannot use soap and water, use hand sanitizer. Make sure that everyone in your home washes their hands well and often. Rest at home until you feel better. Watch your condition for any changes. Take slow and deep breaths when you feel like you may vomit. Keep all follow-up visits. Contact a doctor if: Your symptoms get worse. You have new symptoms. You have a fever. You cannot drink fluids without vomiting. You feel like you may vomit for more than 2 days. You feel light-headed or dizzy. You have a headache. You have muscle cramps. You have a rash. You have pain while peeing. Get help right away if: You have pain in your chest, neck, arm,  or jaw. You feel very weak or you faint. You vomit again and again. You have vomit that is bright red or looks like black coffee grounds. You have bloody or black poop (stools) or poop that looks like tar. You have a very bad headache, a stiff neck, or both. You have very bad pain, cramping, or bloating in your belly (abdomen). You have trouble breathing. You are breathing very quickly. Your heart is beating very quickly. Your skin feels cold and clammy. You feel confused. You have signs of losing too much water in your body, such as: Dark pee, very little pee, or no pee. Cracked lips. Dry mouth. Sunken eyes. Sleepiness. Weakness. These symptoms may be an emergency. Get help right away. Call 911. Do not wait to see if the symptoms will go away. Do not drive yourself to the  hospital. Summary Nausea is feeling that you have an upset stomach and that you are about to vomit. Vomiting is when food in your stomach comes out of your mouth. Follow instructions from your doctor about eating and drinking. Take over-the-counter and prescription medicines only as told by your doctor. Contact your doctor if your symptoms get worse or you have new symptoms. Keep all follow-up visits. This information is not intended to replace advice given to you by your health care provider. Make sure you discuss any questions you have with your health care provider. Document Revised: 05/06/2021 Document Reviewed: 05/06/2021 Elsevier Patient Education  2024 [employer]. Hypertension, Adult Hypertension is another name for high blood pressure. High blood pressure forces your heart to work harder to pump blood. This can cause problems over time. There are two numbers in a blood pressure reading. There is a top number (systolic) over a bottom number (diastolic). It is best to have a blood pressure that is below 120/80. What are the causes? The cause of this condition is not known. Some other conditions can lead to high blood pressure. What increases the risk? Some lifestyle factors can make you more likely to develop high blood pressure: Smoking. Not getting enough exercise or physical activity. Being overweight. Having too much fat, sugar, calories, or salt (sodium) in your diet. Drinking too much alcohol. Other risk factors include: Having any of these conditions: Heart disease. Diabetes. High cholesterol. Kidney disease. Obstructive sleep apnea. Having a family history of high blood pressure and high cholesterol. Age. The risk increases with age. Stress. What are the signs or symptoms? High blood pressure may not cause symptoms. Very high blood pressure (hypertensive crisis) may cause: Headache. Fast or uneven heartbeats (palpitations). Shortness of  breath. Nosebleed. Vomiting or feeling like you may vomit (nauseous). Changes in how you see. Very bad chest pain. Feeling dizzy. Seizures. How is this treated? This condition is treated by making healthy lifestyle changes, such as: Eating healthy foods. Exercising more. Drinking less alcohol. Your doctor may prescribe medicine if lifestyle changes do not help enough and if: Your top number is above 130. Your bottom number is above 80. Your personal target blood pressure may vary. Follow these instructions at home: Eating and drinking  If told, follow the DASH eating plan. To follow this plan: Fill one half of your plate at each meal with fruits and vegetables. Fill one fourth of your plate at each meal with whole grains. Whole grains include whole-wheat pasta, brown rice, and whole-grain bread. Eat or drink low-fat dairy products, such as skim milk or low-fat yogurt. Fill one fourth of your plate at each  meal with low-fat (lean) proteins. Low-fat proteins include fish, chicken without skin, eggs, beans, and tofu. Avoid fatty meat, cured and processed meat, or chicken with skin. Avoid pre-made or processed food. Limit the amount of salt in your diet to less than 1,500 mg each day. Do not drink alcohol if: Your doctor tells you not to drink. You are pregnant, may be pregnant, or are planning to become pregnant. If you drink alcohol: Limit how much you have to: 0-1 drink a day for women. 0-2 drinks a day for men. Know how much alcohol is in your drink. In the [place] one drink equals one 12 oz bottle of beer (355 mL), one 5 oz glass of wine (148 mL), or one 1 oz glass of hard liquor (44 mL). Lifestyle  Work with your doctor to stay at a healthy weight or to lose weight. Ask your doctor what the best weight is for you. Get at least 30 minutes of exercise that causes your heart to beat faster (aerobic exercise) most days of the week. This may include walking, swimming, or  biking. Get at least 30 minutes of exercise that strengthens your muscles (resistance exercise) at least 3 days a week. This may include lifting weights or doing Pilates. Do not smoke or use any products that contain nicotine or tobacco. If you need help quitting, ask your doctor. Check your blood pressure at home as told by your doctor. Keep all follow-up visits. Medicines Take over-the-counter and prescription medicines only as told by your doctor. Follow directions carefully. Do not skip doses of blood pressure medicine. The medicine does not work as well if you skip doses. Skipping doses also puts you at risk for problems. Ask your doctor about side effects or reactions to medicines that you should watch for. Contact a doctor if: You think you are having a reaction to the medicine you are taking. You have headaches that keep coming back. You feel dizzy. You have swelling in your ankles. You have trouble with your vision. Get help right away if: You get a very bad headache. You start to feel mixed up (confused). You feel weak or numb. You feel faint. You have very bad pain in your: Chest. Belly (abdomen). You vomit more than once. You have trouble breathing. These symptoms may be an emergency. Get help right away. Call 911. Do not wait to see if the symptoms will go away. Do not drive yourself to the hospital. Summary Hypertension is another name for high blood pressure. High blood pressure forces your heart to work harder to pump blood. For most people, a normal blood pressure is less than 120/80. Making healthy choices can help lower blood pressure. If your blood pressure does not get lower with healthy choices, you may need to take medicine. This information is not intended to replace advice given to you by your health care provider. Make sure you discuss any questions you have with your health care provider. Document Revised: 08/18/2021 Document Reviewed: 08/18/2021 Elsevier  Patient Education  2024 [employer] Managing Your Hypertension Hypertension, also called high blood pressure, is when the force of the blood pressing against the walls of the arteries is too strong. Arteries are blood vessels that carry blood from your heart throughout your body. Hypertension forces the heart to work harder to pump blood and may cause the arteries to become narrow or stiff. Understanding blood pressure readings A blood pressure reading includes a higher number over a lower number: The first, or  top, number is called the systolic pressure. It is a measure of the pressure in your arteries as your heart beats. The second, or bottom number, is called the diastolic pressure. It is a measure of the pressure in your arteries as the heart relaxes. For most people, a normal blood pressure is below 120/80. Your personal target blood pressure may vary depending on your medical conditions, your age, and other factors. Blood pressure is classified into four stages. Based on your blood pressure reading, your health care provider may use the following stages to determine what type of treatment you need, if any. Systolic pressure and diastolic pressure are measured in a unit called millimeters of mercury (mmHg). Normal Systolic pressure: below 120. Diastolic pressure: below 80. Elevated Systolic pressure: 120-129. Diastolic pressure: below 80. Hypertension stage 1 Systolic pressure: 130-139. Diastolic pressure: 80-89. Hypertension stage 2 Systolic pressure: 140 or above. Diastolic pressure: 90 or above. How can this condition affect me? Managing your hypertension is very important. Over time, hypertension can damage the arteries and decrease blood flow to parts of the body, including the brain, heart, and kidneys. Having untreated or uncontrolled hypertension can lead to: A heart attack. A stroke. A weakened blood vessel (aneurysm). Heart failure. Kidney damage. Eye damage. Memory  and concentration problems. Vascular dementia. What actions can I take to manage this condition? Hypertension can be managed by making lifestyle changes and possibly by taking medicines. Your health care provider will help you make a plan to bring your blood pressure within a normal range. You may be referred for counseling on a healthy diet and physical activity. Nutrition  Eat a diet that is high in fiber and potassium, and low in salt (sodium), added sugar, and fat. An example eating plan is called the DASH diet. DASH stands for Dietary Approaches to Stop Hypertension. To eat this way: Eat plenty of fresh fruits and vegetables. Try to fill one-half of your plate at each meal with fruits and vegetables. Eat whole grains, such as whole-wheat pasta, brown rice, or whole-grain bread. Fill about one-fourth of your plate with whole grains. Eat low-fat dairy products. Avoid fatty cuts of meat, processed or cured meats, and poultry with skin. Fill about one-fourth of your plate with lean proteins such as fish, chicken without skin, beans, eggs, and tofu. Avoid pre-made and processed foods. These tend to be higher in sodium, added sugar, and fat. Reduce your daily sodium intake. Many people with hypertension should eat less than 1,500 mg of sodium a day. Lifestyle  Work with your health care provider to maintain a healthy body weight or to lose weight. Ask what an ideal weight is for you. Get at least 30 minutes of exercise that causes your heart to beat faster (aerobic exercise) most days of the week. Activities may include walking, swimming, or biking. Include exercise to strengthen your muscles (resistance exercise), such as weight lifting, as part of your weekly exercise routine. Try to do these types of exercises for 30 minutes at least 3 days a week. Do not use any products that contain nicotine or tobacco. These products include cigarettes, chewing tobacco, and vaping devices, such as  e-cigarettes. If you need help quitting, ask your health care provider. Control any long-term (chronic) conditions you have, such as high cholesterol or diabetes. Identify your sources of stress and find ways to manage stress. This may include meditation, deep breathing, or making time for fun activities. Alcohol use Do not drink alcohol if: Your health care  provider tells you not to drink. You are pregnant, may be pregnant, or are planning to become pregnant. If you drink alcohol: Limit how much you have to: 0-1 drink a day for women. 0-2 drinks a day for men. Know how much alcohol is in your drink. In the [place] one drink equals one 12 oz bottle of beer (355 mL), one 5 oz glass of wine (148 mL), or one 1 oz glass of hard liquor (44 mL). Medicines Your health care provider may prescribe medicine if lifestyle changes are not enough to get your blood pressure under control and if: Your systolic blood pressure is 130 or higher. Your diastolic blood pressure is 80 or higher. Take medicines only as told by your health care provider. Follow the directions carefully. Blood pressure medicines must be taken as told by your health care provider. The medicine does not work as well when you skip doses. Skipping doses also puts you at risk for problems. Monitoring Before you monitor your blood pressure: Do not smoke, drink caffeinated beverages, or exercise within 30 minutes before taking a measurement. Use the bathroom and empty your bladder (urinate). Sit quietly for at least 5 minutes before taking measurements. Monitor your blood pressure at home as told by your health care provider. To do this: Sit with your back straight and supported. Place your feet flat on the floor. Do not cross your legs. Support your arm on a flat surface, such as a table. Make sure your upper arm is at heart level. Each time you measure, take two or three readings one minute apart and record the results. You may also need  to have your blood pressure checked regularly by your health care provider. General information Talk with your health care provider about your diet, exercise habits, and other lifestyle factors that may be contributing to hypertension. Review all the medicines you take with your health care provider because there may be side effects or interactions. Keep all follow-up visits. Your health care provider can help you create and adjust your plan for managing your high blood pressure. Where to find more information National Heart, Lung, and Blood Institute: popsteam.is American Heart Association: www.heart.org Contact a health care provider if: You think you are having a reaction to medicines you have taken. You have repeated (recurrent) headaches. You feel dizzy. You have swelling in your ankles. You have trouble with your vision. Get help right away if: You develop a severe headache or confusion. You have unusual weakness or numbness, or you feel faint. You have severe pain in your chest or abdomen. You vomit repeatedly. You have trouble breathing. These symptoms may be an emergency. Get help right away. Call 911. Do not wait to see if the symptoms will go away. Do not drive yourself to the hospital. Summary Hypertension is when the force of blood pumping through your arteries is too strong. If this condition is not controlled, it may put you at risk for serious complications. Your personal target blood pressure may vary depending on your medical conditions, your age, and other factors. For most people, a normal blood pressure is less than 120/80. Hypertension is managed by lifestyle changes, medicines, or both. Lifestyle changes to help manage hypertension include losing weight, eating a healthy, low-sodium diet, exercising more, stopping smoking, and limiting alcohol. This information is not intended to replace advice given to you by your health care provider. Make sure you discuss  any questions you have with your health care provider. Document Revised:  07/14/2021 Document Reviewed: 07/14/2021 Elsevier Patient Education  2024 Elsevier Inc.  The above assessment and management plan was discussed with the patient. The patient verbalized understanding of and has agreed to the management plan. Patient is aware to call the clinic if they develop any new symptoms or if symptoms persist or worsen. Patient is aware when to return to the clinic for a follow-up visit. Patient educated on when it is appropriate to go to the emergency department.   Eiman Maret [place] Thompson, DNP [facility] [address] [place], [place] [address] [phone]   "

## 2024-09-10 ENCOUNTER — Other Ambulatory Visit: Payer: Self-pay | Admitting: Nurse Practitioner

## 2024-09-10 DIAGNOSIS — E782 Mixed hyperlipidemia: Secondary | ICD-10-CM

## 2024-09-11 ENCOUNTER — Other Ambulatory Visit (HOSPITAL_COMMUNITY): Payer: Self-pay

## 2024-09-11 ENCOUNTER — Encounter: Payer: Self-pay | Admitting: Nurse Practitioner

## 2024-09-11 ENCOUNTER — Ambulatory Visit: Admitting: Nurse Practitioner

## 2024-09-11 ENCOUNTER — Telehealth: Payer: Self-pay | Admitting: Pharmacy Technician

## 2024-09-11 ENCOUNTER — Ambulatory Visit: Payer: Self-pay | Admitting: Nurse Practitioner

## 2024-09-11 VITALS — BP 138/81 | HR 82 | Temp 98.1°F | Ht 66.0 in | Wt 223.8 lb

## 2024-09-11 DIAGNOSIS — I10 Essential (primary) hypertension: Secondary | ICD-10-CM | POA: Diagnosis not present

## 2024-09-11 DIAGNOSIS — M25511 Pain in right shoulder: Secondary | ICD-10-CM

## 2024-09-11 DIAGNOSIS — E66812 Obesity, class 2: Secondary | ICD-10-CM

## 2024-09-11 DIAGNOSIS — E782 Mixed hyperlipidemia: Secondary | ICD-10-CM | POA: Diagnosis not present

## 2024-09-11 DIAGNOSIS — Z6835 Body mass index (BMI) 35.0-35.9, adult: Secondary | ICD-10-CM

## 2024-09-11 DIAGNOSIS — E1165 Type 2 diabetes mellitus with hyperglycemia: Secondary | ICD-10-CM

## 2024-09-11 DIAGNOSIS — E119 Type 2 diabetes mellitus without complications: Secondary | ICD-10-CM

## 2024-09-11 DIAGNOSIS — G8929 Other chronic pain: Secondary | ICD-10-CM | POA: Insufficient documentation

## 2024-09-11 DIAGNOSIS — R112 Nausea with vomiting, unspecified: Secondary | ICD-10-CM

## 2024-09-11 DIAGNOSIS — Z7984 Long term (current) use of oral hypoglycemic drugs: Secondary | ICD-10-CM

## 2024-09-11 LAB — BAYER DCA HB A1C WAIVED: HB A1C (BAYER DCA - WAIVED): 8.4 % — ABNORMAL HIGH (ref 4.8–5.6)

## 2024-09-11 MED ORDER — ONDANSETRON 4 MG PO TBDP: 4.0000 mg | ORAL_TABLET | Freq: Three times a day (TID) | ORAL | 0 refills | Status: AC | PRN

## 2024-09-11 MED ORDER — METFORMIN HCL 1000 MG PO TABS: 1000.0000 mg | ORAL_TABLET | Freq: Two times a day (BID) | ORAL | 0 refills | Status: AC

## 2024-09-11 MED ORDER — TIZANIDINE HCL 2 MG PO TABS: 2.0000 mg | ORAL_TABLET | Freq: Four times a day (QID) | ORAL | 1 refills | Status: AC | PRN

## 2024-09-11 MED ORDER — KETOROLAC TROMETHAMINE 30 MG/ML IJ SOLN
30.0000 mg | Freq: Once | INTRAMUSCULAR | Status: AC
  Administered 2024-09-11: 30 mg via INTRAMUSCULAR

## 2024-09-11 MED ORDER — NAPROXEN 500 MG PO TABS: 500.0000 mg | ORAL_TABLET | Freq: Two times a day (BID) | ORAL | 0 refills | Status: AC

## 2024-09-11 MED ORDER — ATORVASTATIN CALCIUM 20 MG PO TABS: 20.0000 mg | ORAL_TABLET | Freq: Every day | ORAL | 0 refills | Status: AC

## 2024-09-11 MED ORDER — LISINOPRIL 10 MG PO TABS: 10.0000 mg | ORAL_TABLET | Freq: Every day | ORAL | 0 refills | Status: AC

## 2024-09-11 MED ORDER — TIZANIDINE HCL 2 MG PO CAPS
2.0000 mg | ORAL_CAPSULE | Freq: Three times a day (TID) | ORAL | 1 refills | Status: DC | PRN
Start: 2024-09-11 — End: 2024-09-11

## 2024-09-11 MED ORDER — DAPAGLIFLOZIN PROPANEDIOL 5 MG PO TABS: 5.0000 mg | ORAL_TABLET | Freq: Every day | ORAL | 0 refills | Status: AC

## 2024-09-11 NOTE — Telephone Encounter (Signed)
 Pharmacy Patient Advocate Encounter   Received notification from Onbase that prior authorization for tiZANidine  HCl 2MG  capsules is required/requested.   Insurance verification completed.   The patient is insured through [employer].   Per test claim:  TIZANIDINE  2 MG TABLET is preferred by the insurance.  If suggested medication is appropriate, Please send in a new RX and discontinue this one. If not, please advise as to why it's not appropriate so that we may request a Prior Authorization. Please note, some preferred medications may still require a PA.  If the suggested medications have not been trialed and there are no contraindications to their use, the PA will not be submitted, as it will not be approved.  The prescription was filled for capsules but the insurance will not cover capsules, the script need to be changed to tablets and his copay will be $3.63

## 2024-09-11 NOTE — Telephone Encounter (Signed)
 Tizanidine  tablet form sent

## 2024-09-11 NOTE — Addendum Note (Signed)
 Addended by: LEIGH ROSINA SAILOR on: 09/11/2024 03:02 PM   Modules accepted: Orders

## 2024-09-11 NOTE — Telephone Encounter (Signed)
 Please verify that switching from capsule to tablet is alright.

## 2024-09-11 NOTE — Addendum Note (Signed)
 Addended by: LEIGH ROSINA SAILOR on: 09/11/2024 04:16 PM   Modules accepted: Orders

## 2024-09-30 ENCOUNTER — Ambulatory Visit: Admitting: Orthopedic Surgery

## 2024-09-30 ENCOUNTER — Encounter: Payer: Self-pay | Admitting: Orthopedic Surgery

## 2024-09-30 VITALS — BP 144/93 | HR 96 | Ht 66.0 in | Wt 220.0 lb

## 2024-09-30 DIAGNOSIS — M7501 Adhesive capsulitis of right shoulder: Secondary | ICD-10-CM | POA: Diagnosis not present

## 2024-09-30 NOTE — Progress Notes (Signed)
 New Patient Visit  Assessment: Andres Carter is a 53 y.o. male with the following: 1. Adhesive capsulitis of right shoulder   Plan: Andres Carter has pain and stiffness in the right shoulder.  Presentation is consistent with right shoulder adhesive capsulitis.  This been ongoing for 2-3 months.  Pathology was discussed with the patient in clinic today.  I have recommended ultrasound-guided steroid injection.  This is a high-volume injection.  I would like to see him back in 1 month.  As the shoulder improves, I have encouraged him to work on gentle stretching.  He states understanding.  Depending on the improvement noted at the next visit, we may have to consider a referral to Dr. Burnetta for continued high-volume steroid injections.  Procedure note injection - Right shoulder, ultrasound guidance   Verbal consent was obtained to inject the Right shoulder, glenohumeral joint  Timeout was completed to confirm the site of injection.   Using the ultrasound, the rotator cuff tendons were identified.  The joint space was also identified. The skin was prepped with alcohol and ethyl chloride was sprayed at the injection site.  A 21-gauge needle was used to inject 40 mg of Depo-Medrol and 1% lidocaine  (4 cc) and 5 cc of normal saline into the glenohumeral joint space of the Right shoulder using a posterolateral approach.  The needle was visualized entering the glenohumeral joint, and the medication was also visualized. There were no complications.  A sterile bandage was applied.   Note: In order to accurately identify the placement of the needle, ultrasound was required, to increase the accuracy, and specificity of the injection.   Follow-up: Return in about 4 weeks (around 10/28/2024).  Subjective:  Chief Complaint  Patient presents with   Shoulder Pain    Right/ couple months no injury / hurts at night also     History of Present Illness: Andres Carter is a 53 y.o. male who  has been referred by Nena Shelvy Morton Sebastian, NP for evaluation of right shoulder pain.  He has had pain in the right shoulder for a few months.  No specific injury.  Pain gets worse at night.  Makes his job difficult at work.  He is a diabetic.  He has very little motion of his right shoulder.  Medications have not been effective.  No prior injury to his right shoulder.   Review of Systems: No fevers or chills No numbness or tingling No chest pain No shortness of breath No bowel or bladder dysfunction No GI distress No headaches   Medical History:  Past Medical History:  Diagnosis Date   Arthritis    gout   Cancer (HCC)    Prostate   Diabetes mellitus without complication (HCC)    Hypertension     Past Surgical History:  Procedure Laterality Date   LYMPH NODE DISSECTION Bilateral 08/16/2022   Procedure: LYMPH NODE DISSECTION;  Surgeon: Alvaro Hummer, MD;  Location: [facility];  Service: Urology;  Laterality: Bilateral;   NO PAST SURGERIES     ROBOT ASSISTED LAPAROSCOPIC RADICAL PROSTATECTOMY N/A 08/16/2022   Procedure: XI ROBOTIC ASSISTED LAPAROSCOPIC RADICAL PROSTATECTOMY WITH INDOCYANINE GREEN DYE;  Surgeon: Alvaro Hummer, MD;  Location: [facility];  Service: Urology;  Laterality: N/A;    Family History  Problem Relation Age of Onset   Diabetes Mother    Cancer Father    Diabetes Father    Social History   Tobacco Use   Smoking status: Never  Vaping Use  Vaping status: Never Used  Substance Use Topics   Alcohol use: No    Alcohol/week: 0.0 standard drinks of alcohol   Drug use: No    No Known Allergies  Current Meds  Medication Sig   albuterol  (VENTOLIN  HFA) 108 (90 Base) MCG/ACT inhaler Inhale 2 puffs into the lungs every 6 (six) hours as needed for wheezing or shortness of breath.   atorvastatin  (LIPITOR) 20 MG tablet Take 1 tablet (20 mg total) by mouth daily.   dapagliflozin  propanediol (FARXIGA ) 5 MG TABS tablet Take 1 tablet (5 mg total) by mouth daily  before breakfast.   lisinopril  (ZESTRIL ) 10 MG tablet Take 1 tablet (10 mg total) by mouth daily.   metFORMIN  (GLUCOPHAGE ) 1000 MG tablet Take 1 tablet (1,000 mg total) by mouth 2 (two) times daily with a meal.   naproxen (NAPROSYN) 500 MG tablet Take 1 tablet (500 mg total) by mouth 2 (two) times daily with a meal.   ondansetron  (ZOFRAN -ODT) 4 MG disintegrating tablet Take 1 tablet (4 mg total) by mouth every 8 (eight) hours as needed for nausea or vomiting.   Semaglutide ,0.25 or 0.5MG /DOS, 2 MG/3ML SOPN Inject 0.5 mg into the skin every 7 (seven) days.   tiZANidine  (ZANAFLEX ) 2 MG tablet Take 1 tablet (2 mg total) by mouth every 6 (six) hours as needed for muscle spasms.    Objective: BP (!) 144/93   Pulse 96   Ht 5' 6 (1.676 m)   Wt 220 lb (99.8 kg)   BMI 35.51 kg/m   Physical Exam:  General: Alert and oriented. and No acute distress. Gait: Normal gait.  Evaluation of the right shoulder demonstrates no deformity. no redness.  No swelling.  Forward flexion limited to 80 degrees.  Passive forward flexion is to difficult due to pain.  He is unable to reach his back pocket.  Fingers are warm and well-perfused..  Essentially no external rotation at his side.  IMAGING: I personally reviewed images previously obtained in clinic  X-rays of the right shoulder were available clinic today.  No acute injuries.  Mild to moderate degenerative change of the [facility] joint.  No additional injuries noted.  New Medications:  No orders of the defined types were placed in this encounter.     Oneil DELENA Horde, MD  09/30/2024 3:42 PM

## 2024-09-30 NOTE — Patient Instructions (Addendum)
 Note for work - no lifting anything greater than 20 pounds recommend assistance if he has to lift anything greater than 20 pounds.   Instructions Following Joint Injections  In clinic today, you received an injection in one of your joints (sometimes more than one).  Occasionally, you can have some pain at the injection site, this is normal.  You can place ice at the injection site, or take over-the-counter medications such as Tylenol  (acetaminophen ) or Advil (ibuprofen).  Please follow all directions listed on the bottle.  If your joint (knee or shoulder) becomes swollen, red or very painful, please contact the clinic for additional assistance.   Two medications were injected, including lidocaine  and a steroid (often referred to as cortisone).  Lidocaine  is effective almost immediately but wears off quickly.  However, the steroid can take a few days to improve your symptoms.  In some cases, it can make your pain worse for a couple of days.  Do not be concerned if this happens as it is common.  You can apply ice or take some over-the-counter medications as needed.   Injections in the same joint cannot be repeated for 3 months.  This helps to limit the risk of an infection in the joint.  If you were to develop an infection in your joint, the best treatment option would be surgery.

## 2024-10-01 ENCOUNTER — Telehealth: Payer: Self-pay

## 2024-10-01 ENCOUNTER — Encounter: Payer: Self-pay | Admitting: Orthopedic Surgery

## 2024-10-01 NOTE — Telephone Encounter (Signed)
 Patient is here asking if he could get a note to be out for the rest of the week for his shoulder.  Stated that he has had pain since about 4 am this morning, so he stated he should have gotten the note yesterday.  Please advise

## 2024-10-01 NOTE — Telephone Encounter (Signed)
 OK to give out of work note.

## 2024-10-27 ENCOUNTER — Other Ambulatory Visit: Payer: Self-pay | Admitting: Nurse Practitioner

## 2024-10-27 DIAGNOSIS — E782 Mixed hyperlipidemia: Secondary | ICD-10-CM

## 2024-10-27 NOTE — Telephone Encounter (Unsigned)
 Copied from CRM #[id number]. Topic: Clinical - Medication Refill >> Oct 27, 2024  1:28 PM Fredrica W wrote: Medication: atorvastatin  (LIPITOR) 20 MG tablet  Has the patient contacted their pharmacy? Yes (Agent: If no, request that the patient contact the pharmacy for the refill. If patient does not wish to contact the pharmacy document the reason why and proceed with request.) (Agent: If yes, when and what did the pharmacy advise?) No refills   This is the patient's preferred pharmacy:  [id number] - [place], [place] - [address] [place] [address] Phone: [phone] Fax: [phone]   Is this the correct pharmacy for this prescription? Yes If no, delete pharmacy and type the correct one.   Has the prescription been filled recently? Yes  Is the patient out of the medication? No  Has the patient been seen for an appointment in the last year OR does the patient have an upcoming appointment? Yes  Can we respond through MyChart? No  Agent: Please be advised that Rx refills may take up to 3 business days. We ask that you follow-up with your pharmacy.

## 2024-10-28 ENCOUNTER — Ambulatory Visit: Admitting: Orthopedic Surgery

## 2024-10-28 ENCOUNTER — Encounter: Payer: Self-pay | Admitting: Orthopedic Surgery

## 2024-10-28 DIAGNOSIS — M7501 Adhesive capsulitis of right shoulder: Secondary | ICD-10-CM

## 2024-10-28 NOTE — Telephone Encounter (Signed)
 Informed pt that this was sent in on 09/11/24, they should have it on file, he may need to ask for it by name it may have been given a different Rx# when sent in.

## 2024-10-28 NOTE — Addendum Note (Signed)
 Addended by: VICENTA EMMIE HERO on: 10/28/2024 10:45 AM   Modules accepted: Orders

## 2024-10-28 NOTE — Progress Notes (Signed)
 Return patient Visit  Assessment: Andres Carter is a 53 y.o. male with the following: 1. Adhesive capsulitis of right shoulder   Plan: Andres Carter continues to have pain and stiffness in the right shoulder.  Presentation is consistent with adhesive capsulitis.  Previous injection provided minimal improvement in pain and motion.  I would like for him to be evaluated by Dr. Burnetta, with consideration for high-volume steroid injections.  This was discussed with the patient.  He is in agreement.  He will follow-up with me as needed.   Follow-up: Return for Referral to Dr. Burnetta.  Subjective:  Chief Complaint  Patient presents with   Shoulder Pain    R hasn't gotten any worse but still has pain a little more ROM but not much.     History of Present Illness: Andres Carter is a 53 y.o. male who returns to clinic for repeat evaluation of right shoulder pain.  I saw him in clinic about a month ago.  At that time, we completed an ultrasound-guided injection of the right shoulder for adhesive capsulitis.  He notes mild improvements in pain, as well as function.  However, he continues to have a lot of pain and limited motion overall.   Review of Systems: No fevers or chills No numbness or tingling No chest pain No shortness of breath No bowel or bladder dysfunction No GI distress No headaches    Objective: There were no vitals taken for this visit.  Physical Exam:  General: Alert and oriented. and No acute distress. Gait: Normal gait.  Evaluation of the right shoulder demonstrates no deformity. no redness.  No swelling.  Active forward flexion limited to 90 degrees, but he appears more comfortable compared to last time.   He is unable to reach his back pocket.  Fingers are warm and well-perfused.SABRA  Approximately 10-15 degrees of external rotation at his side.  IMAGING: I personally reviewed images previously obtained in clinic no new imaging obtained  today.  New Medications:  No orders of the defined types were placed in this encounter.     Andres DELENA Horde, MD  10/28/2024 10:31 AM

## 2024-11-24 ENCOUNTER — Encounter: Payer: Self-pay | Admitting: Sports Medicine

## 2024-11-24 ENCOUNTER — Other Ambulatory Visit: Payer: Self-pay

## 2024-11-24 ENCOUNTER — Ambulatory Visit: Admitting: Sports Medicine

## 2024-11-24 DIAGNOSIS — M25511 Pain in right shoulder: Secondary | ICD-10-CM

## 2024-11-24 DIAGNOSIS — E1165 Type 2 diabetes mellitus with hyperglycemia: Secondary | ICD-10-CM | POA: Diagnosis not present

## 2024-11-24 DIAGNOSIS — M7501 Adhesive capsulitis of right shoulder: Secondary | ICD-10-CM | POA: Diagnosis not present

## 2024-11-24 DIAGNOSIS — G8929 Other chronic pain: Secondary | ICD-10-CM | POA: Diagnosis not present

## 2024-11-24 MED ORDER — LIDOCAINE HCL 1 % IJ SOLN
3.0000 mL | INTRAMUSCULAR | Status: AC | PRN
  Administered 2024-11-24: 3 mL

## 2024-11-24 MED ORDER — BUPIVACAINE HCL 0.25 % IJ SOLN
3.0000 mL | INTRAMUSCULAR | Status: AC | PRN
  Administered 2024-11-24: 3 mL via INTRA_ARTICULAR

## 2024-11-24 MED ORDER — METHYLPREDNISOLONE ACETATE 40 MG/ML IJ SUSP
40.0000 mg | INTRAMUSCULAR | Status: AC | PRN
  Administered 2024-11-24: 40 mg via INTRA_ARTICULAR

## 2024-11-24 NOTE — Progress Notes (Addendum)
 "  Andres Carter - 54 y.o. male MRN [id number]  Date of birth: [birth year]  Office Visit Note: Visit Date: 11/24/2024 PCP: Deitra Morton Sebastian Nena, NP Referred by: Onesimo Oneil LABOR, MD  Subjective: Chief Complaint  Patient presents with   Right Shoulder - Pain   HPI: Andres Carter is a pleasant 54 y.o. male who presents today for chronic right shoulder pain and stiffness, concerning for adhesive capsulitis.  Discussed the use of AI scribe software for clinical note transcription with the patient, who gave verbal consent to proceed.  History of Present Illness Andres Carter is a 54 year old male with type-II diabetes who presents with right shoulder pain and stiffness.  For 4 months he has had deep right shoulder pain radiating down the lateral deltoid with marked stiffness. He cannot reach behind his back, which interferes with dressing and other daily activities.  He has intermittent pain and paresthesia in the right fifth digit, most noticeable when lying down and occurring with shoulder discomfort.  He cannot perform heavy lifting at work with the right arm and compensates with the left arm.  A prior right shoulder corticosteroid injection gave relief for a few days, then pain worsened the next morning and caused him to miss 1-2 days of work. Short trials of ibuprofen and acetaminophen  provided little benefit, so he stopped them. He has not done physical therapy or formal shoulder stretching.  He has type II diabetes managed with metformin , Farxiga , and intermittent Ozempic , which he avoids consistent use to gastrointestinal side effects. He does not monitor blood glucose regularly and is not on insulin .  He is a type-II diabetic, managed on Ozempic  0.5 mg IM weekly, metformin  1000 mg twice daily, Farxiga  5 mg daily.  Lab Results  Component Value Date   HGBA1C 8.4 (H) 09/11/2024    Pertinent ROS were reviewed with the patient and found to be negative unless  otherwise specified above in HPI.   Assessment & Plan: Visit Diagnoses:  1. Adhesive capsulitis of right shoulder   2. Chronic right shoulder pain   3. 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 Diabetes increases likelihood. - Performed right shoulder ultrasound-guided injection with hydrodilatation with intra-articular steroid and anesthetic injection. - Instructed to apply ice to shoulder for 15-20 minutes tonight and tomorrow. - Advised ibuprofen or naproxen  for short-term analgesia. - Provided work note for absence today and tomorrow; return to work Wednesday with modified duties x 2 days. - Referred to physical therapy for supervised stretching and range of motion exercises. - Provided home exercise handout (Frozen shoulder protocol) to start Thursday after 48 hours rest post-injection. - Scheduled follow-up in 2 weeks to reassess and consider repeat injection if needed depending on improvement and ROM --> consider HV-inj  Type-II Diabetes, not-at goal Lab Results  Component Value Date   HGBA1C 8.4 (H) 09/11/2024   Type-II diabetes managed with oral hypoglycemics, GLP-1. Discussed how increase blood glucose relates to frozen shoulder. - Counseled on potential transient hyperglycemia post-steroid injection; advised symptom monitoring and hydration, checking bG as needed - Reinforced adequate glucose control to help with overall improvement of adhesive capsulitis - Will continue metformin  1000 mg twice daily, Farxiga  5 mg daily, Ozempic  IM weekly  Follow-up: Return in about 2 weeks (around 12/08/2024) for Frozen Shoulder f/u  Meds & Orders: No orders of the defined types were placed in this encounter.   Orders Placed This Encounter  Procedures   US  Guided  Needle Placement - No Linked Charges     Procedures: Large Joint Inj: R glenohumeral on 11/24/2024 4:43 PM Indications: pain Details: 22 G 3.5 in needle, ultrasound-guided posterior approach Medications: 3 mL lidocaine  1 %; 3 mL bupivacaine  0.25 %; 40 mg methylPREDNISolone  acetate 40 MG/ML Outcome: tolerated well, no immediate complications  US -guided glenohumeral joint injection, right shoulder After discussion on risks/benefits/indications, informed verbal consent was obtained. A timeout was then performed. The patient was positioned lying lateral recumbent on examination table. The patient's shoulder was prepped with betadine and multiple alcohol swabs and utilizing ultrasound guidance, the patient's glenohumeral joint was identified on ultrasound. Using ultrasound guidance a 22-gauge, 3.5 inch needle with a mixture of 2:2:1 cc's lidocaine :bupivicaine:depomedrol was directed from a lateral to medial direction via in-plane technique into the glenohumeral joint with visualization of appropriate spread of injectate into the joint. Patient tolerated the procedure well without immediate complications.      Procedure, treatment alternatives, risks and benefits explained, specific risks discussed. Consent was given by the patient. Immediately prior to procedure a time out was called to verify the correct patient, procedure, equipment, support staff and site/side marked as required. Patient was prepped and draped in the usual sterile fashion.          Clinical History: No specialty comments available.  He reports that he has never smoked. He does not have any smokeless tobacco history on file.  Recent Labs    06/09/24 1502 09/11/24 1043  HGBA1C 10.8* 8.4*    Objective:    Physical Exam  Gen: Well-appearing, in no acute distress; non-toxic CV: Well-perfused. Warm.  Resp: Breathing unlabored on room air; no wheezing. Psych: Fluid speech in conversation; appropriate affect; normal  thought process  *MSK/Ortho Exam: Physical Exam MUSCULOSKELETAL: Right shoulder exhibits pain and stiffness upon palpation with severely limited range of motion, unable to fully elevate the arm.  *R-shoulder: No redness swelling or effusion.  There is positive TTP over the anterior joint recess and at Codman's point.  There is marked restriction in both active and passive range of motion, ROM as follows:  - Flex: 85 degrees - Abd: 80 degrees - ER: 10-15 degrees - IR: Thumb to SI-jt (compared to LT of the contralateral arm)  Imaging:  *I did personally review and interpret three-view right shoulder x-ray from 06/09/2024 during the visit today.  There is very mild AC joint arthritic change, no significant glenohumeral joint narrowing or arthritic change.  Very minimal inferior humeral head spur.  No acute fracture noted.  Narrative & Impression  CLINICAL DATA:  Acute pain of right shoulder.   EXAM: RIGHT SHOULDER - 2+ VIEW   COMPARISON:  None Available.   FINDINGS: There is no evidence of fracture or dislocation. Minor acromioclavicular spurring. There is no other evidence of arthropathy or focal bone abnormality. Soft tissues are unremarkable.   IMPRESSION: Minor acromioclavicular spurring.     Electronically Signed   By: Andrea Gasman M.D.   On: 06/14/2024 16:37    Past Medical/Family/Surgical/Social History: Medications & Allergies reviewed per EMR, new medications updated. Patient Active Problem List   Diagnosis Date Noted   Chronic right shoulder pain 09/11/2024   Encounter for general adult medical examination with abnormal findings 06/09/2024   Class 2 obesity without serious comorbidity with body mass index (BMI) of 35.0 to 35.9  in adult 06/09/2024   Acute pain of right shoulder 06/09/2024   Primary hypertension 12/29/2022   Prostate cancer (HCC) 08/16/2022   Malignant neoplasm of prostate (HCC) 06/21/2022   Elevated serum creatinine 03/09/2022    Hyperlipidemia 03/09/2022   Type 2 diabetes mellitus with hyperglycemia, without long-term current use of insulin  (HCC) 03/09/2022   Past Medical History:  Diagnosis Date   Arthritis    gout   Cancer (HCC)    Prostate   Diabetes mellitus without complication (HCC)    Hypertension    Family History  Problem Relation Age of Onset   Diabetes Mother    Cancer Father    Diabetes Father    Past Surgical History:  Procedure Laterality Date   LYMPH NODE DISSECTION Bilateral 08/16/2022   Procedure: LYMPH NODE DISSECTION;  Surgeon: Alvaro Hummer, MD;  Location: [facility];  Service: Urology;  Laterality: Bilateral;   NO PAST SURGERIES     ROBOT ASSISTED LAPAROSCOPIC RADICAL PROSTATECTOMY N/A 08/16/2022   Procedure: XI ROBOTIC ASSISTED LAPAROSCOPIC RADICAL PROSTATECTOMY WITH INDOCYANINE GREEN DYE;  Surgeon: Alvaro Hummer, MD;  Location: [facility];  Service: Urology;  Laterality: N/A;   Social History   Occupational History   Not on file  Tobacco Use   Smoking status: Never   Smokeless tobacco: Not on file  Vaping Use   Vaping status: Never Used  Substance and Sexual Activity   Alcohol use: No    Alcohol/week: 0.0 standard drinks of alcohol   Drug use: No   Sexual activity: Yes   "

## 2024-11-24 NOTE — Progress Notes (Signed)
 Patient says that he has had right shoulder pain for about 4 months now that has gotten progressively worse. He does not have any known injury. He says that he lifts a lot for work, and has had to use his left arm more with these lifts. He is limited in ROM due to pain and a feeling of getting stuck. He had some temporary relief from his injection, although it only lasted a couple of days. He denies having any popping or clicking in the shoulder.

## 2024-11-25 ENCOUNTER — Other Ambulatory Visit: Payer: Self-pay | Admitting: Sports Medicine

## 2024-11-25 DIAGNOSIS — M7501 Adhesive capsulitis of right shoulder: Secondary | ICD-10-CM

## 2024-12-08 ENCOUNTER — Ambulatory Visit: Admitting: Sports Medicine

## 2024-12-10 ENCOUNTER — Ambulatory Visit: Admitting: Sports Medicine

## 2024-12-10 ENCOUNTER — Encounter: Payer: Self-pay | Admitting: Sports Medicine

## 2024-12-10 ENCOUNTER — Other Ambulatory Visit: Payer: Self-pay

## 2024-12-10 DIAGNOSIS — G8929 Other chronic pain: Secondary | ICD-10-CM

## 2024-12-10 DIAGNOSIS — E1165 Type 2 diabetes mellitus with hyperglycemia: Secondary | ICD-10-CM | POA: Diagnosis not present

## 2024-12-10 DIAGNOSIS — M25511 Pain in right shoulder: Secondary | ICD-10-CM

## 2024-12-10 DIAGNOSIS — M7501 Adhesive capsulitis of right shoulder: Secondary | ICD-10-CM

## 2024-12-10 MED ORDER — METHYLPREDNISOLONE ACETATE 40 MG/ML IJ SUSP
40.0000 mg | INTRAMUSCULAR | Status: AC | PRN
  Administered 2024-12-10: 40 mg via INTRA_ARTICULAR

## 2024-12-10 MED ORDER — LIDOCAINE HCL 1 % IJ SOLN
33.0000 mL | INTRAMUSCULAR | Status: AC | PRN
  Administered 2024-12-10: 33 mL

## 2024-12-10 MED ORDER — BUPIVACAINE HCL 0.25 % IJ SOLN
3.0000 mL | INTRAMUSCULAR | Status: AC | PRN
  Administered 2024-12-10: 3 mL via INTRA_ARTICULAR

## 2024-12-10 NOTE — Progress Notes (Signed)
 Patient says that he is a bit better since the injection, but does still feel quite restricted in his motion. He has been doing his home exercises, and struggles most with stretches overhead. He says that when he tries to push his motion he does still have pain.

## 2024-12-10 NOTE — Progress Notes (Signed)
 "   Andres Carter - 54 y.o. male MRN [id number]  Date of birth: 04-14-1971  Office Visit Note: Visit Date: 12/10/2024 PCP: Andres Morton Sebastian Nena, NP Referred by: Andres Morton Sebastian Nena, NP  Subjective: Chief Complaint  Patient presents with   Right Shoulder - Follow-up   HPI: Andres Carter is a pleasant 54 y.o. male who presents today for follow-up of chronic right shoulder with adhesive capsulitis.  [name] is doing somewhat better after his injection about 2 weeks ago.  His pain is better and he does have slightly more motion although continues to feel restricted.  He has been doing his home exercises although is struggling with overhead motion within these.  He does have his first formalized physical therapy appointment upcoming on 12/18/2024.  Uses ibuprofen only as needed.  He is a type-II diabetic, managed on Ozempic  0.5 mg IM weekly, metformin  1000 mg twice daily, Farxiga  5 mg daily. Tolerated last CSI injection without issues.  Lab Results  Component Value Date   HGBA1C 8.4 (H) 09/11/2024   Pertinent ROS were reviewed with the patient and found to be negative unless otherwise specified above in HPI.   Assessment & Plan: Visit Diagnoses:  1. Adhesive capsulitis of right shoulder   2. Chronic right shoulder pain   3. Type 2 diabetes mellitus with hyperglycemia, without long-term current use of insulin  (HCC)    Plan: Impression is chronic pain and restriction in the right shoulder with adhesive capsulitis in the setting of moderately uncontrolled type 2 diabetes.  He did receive some improvement both in pain and range of motion after previous intra-articular injection, through shared decision making did proceed with high-volume glenohumeral joint injection under ultrasound guidance, patient tolerated well.  May use ice/heat as well as ibuprofen over the next few days for postinjection pain.  He will continue his home exercise handout for frozen shoulder protocol  starting then after 48 hours from injection and we will progress into formalized physical therapy for his frozen shoulder upcoming in early February.  Did describe transient glucose increase given CSI, he will continue his metformin  100 mg twice daily, Farxiga  5 mg daily.  Recommend improved glucose control to help with improvement in his adhesive capsulitis.  Did discuss the nature and pathophysiology of adhesive capsulitis and how expectation would be improvement over months.  I would like to see him back in about 2 months for reevaluation.  He may call or return sooner if any issues arise.  Follow-up: Return in about 2 months (around 02/07/2025) for R-shoulder .   Meds & Orders: No orders of the defined types were placed in this encounter.   Orders Placed This Encounter  Procedures   US  Guided Needle Placement - No Linked Charges     Procedures: Large Joint Inj: R glenohumeral on 12/10/2024 11:45 AM Indications: pain Details: 22 G 3.5 in needle, ultrasound-guided posterior approach Medications: 33 mL lidocaine  1 %; 3 mL bupivacaine  0.25 %; 40 mg methylPREDNISolone  acetate 40 MG/ML (9 cc of D50W) Outcome: tolerated well, no immediate complications  US -guided glenohumeral joint high-volume injection, right shoulder After discussion on risks/benefits/indications, informed verbal consent was obtained. A timeout was then performed. The patient was positioned lying lateral recumbent on examination table. The patient's shoulder was prepped with betadine and multiple alcohol swabs and utilizing ultrasound guidance, the patient's glenohumeral joint was identified on ultrasound. Using ultrasound guidance a 22-gauge, 3.5 inch needle with a mixture of 3:3:1:9 cc's lidocaine :bupivicaine:depomedrol:D50W was directed from a lateral  to medial direction via in-plane technique into the glenohumeral joint with visualization of appropriate spread of injectate into the joint. Patient tolerated the procedure well  without immediate complications.      Procedure, treatment alternatives, risks and benefits explained, specific risks discussed. Consent was given by the patient. Immediately prior to procedure a time out was called to verify the correct patient, procedure, equipment, support staff and site/side marked as required. Patient was prepped and draped in the usual sterile fashion.          Clinical History: No specialty comments available.  He reports that he has never smoked. He does not have any smokeless tobacco history on file.  Recent Labs    06/09/24 1502 09/11/24 1043  HGBA1C 10.8* 8.4*    Objective:    Physical Exam  Gen: Well-appearing, in no acute distress; non-toxic CV: Well-perfused. Warm.  Resp: Breathing unlabored on room air; no wheezing. Psych: Fluid speech in conversation; appropriate affect; normal thought process  Ortho Exam - Right shoulder: No redness swelling or effusion.  There is continued restriction in both passive and active range of motion from last visit to today's visit is as follows:  - Flex: 85 --> 95 degrees - Abd: 80 --> 95 degrees - ER: 10-15 --> 20 degrees - IR: Thumb to SI-jt (compared to L1 of the contralateral arm)  Imaging: No results found.  Past Medical/Family/Surgical/Social History: Medications & Allergies reviewed per EMR, new medications updated. Patient Active Problem List   Diagnosis Date Noted   Chronic right shoulder pain 09/11/2024   Encounter for general adult medical examination with abnormal findings 06/09/2024   Class 2 obesity without serious comorbidity with body mass index (BMI) of 35.0 to 35.9 in adult 06/09/2024   Acute pain of right shoulder 06/09/2024   Primary hypertension 12/29/2022   Prostate cancer (HCC) 08/16/2022   Malignant neoplasm of prostate (HCC) 06/21/2022   Elevated serum creatinine 03/09/2022   Hyperlipidemia 03/09/2022   Type 2 diabetes mellitus with hyperglycemia, without long-term current use  of insulin  (HCC) 03/09/2022   Past Medical History:  Diagnosis Date   Arthritis    gout   Cancer (HCC)    Prostate   Diabetes mellitus without complication (HCC)    Hypertension    Family History  Problem Relation Age of Onset   Diabetes Mother    Cancer Father    Diabetes Father    Past Surgical History:  Procedure Laterality Date   LYMPH NODE DISSECTION Bilateral 08/16/2022   Procedure: LYMPH NODE DISSECTION;  Surgeon: Alvaro Hummer, MD;  Location: [facility];  Service: Urology;  Laterality: Bilateral;   NO PAST SURGERIES     ROBOT ASSISTED LAPAROSCOPIC RADICAL PROSTATECTOMY N/A 08/16/2022   Procedure: XI ROBOTIC ASSISTED LAPAROSCOPIC RADICAL PROSTATECTOMY WITH INDOCYANINE GREEN DYE;  Surgeon: Alvaro Hummer, MD;  Location: [facility];  Service: Urology;  Laterality: N/A;   Social History   Occupational History   Not on file  Tobacco Use   Smoking status: Never   Smokeless tobacco: Not on file  Vaping Use   Vaping status: Never Used  Substance and Sexual Activity   Alcohol use: No    Alcohol/week: 0.0 standard drinks of alcohol   Drug use: No   Sexual activity: Yes   "

## 2024-12-16 ENCOUNTER — Ambulatory Visit: Admitting: Nurse Practitioner

## 2024-12-17 ENCOUNTER — Ambulatory Visit (HOSPITAL_COMMUNITY): Admitting: Occupational Therapy

## 2024-12-17 DIAGNOSIS — M25611 Stiffness of right shoulder, not elsewhere classified: Secondary | ICD-10-CM

## 2024-12-17 DIAGNOSIS — R29898 Other symptoms and signs involving the musculoskeletal system: Secondary | ICD-10-CM

## 2024-12-17 DIAGNOSIS — G8929 Other chronic pain: Secondary | ICD-10-CM

## 2024-12-17 NOTE — Therapy (Signed)
 " OUTPATIENT OCCUPATIONAL THERAPY ORTHO EVALUATION  Patient Name: Andres Carter MRN: [id number] DOB:November 08, 1971, 54 y.o., male Today's Date: 12/17/2024   END OF SESSION:  OT End of Session - 12/17/24 1607     Visit Number 1    Number of Visits 12    Date for Recertification  02/13/25    Authorization Type [employer]    Authorization Time Period No Auth    OT Start Time 1519    OT Stop Time 1600    OT Time Calculation (min) 41 min    Activity Tolerance Patient tolerated treatment well    Behavior During Therapy WFL for tasks assessed/performed          Past Medical History:  Diagnosis Date   Arthritis    gout   Cancer (HCC)    Prostate   Diabetes mellitus without complication (HCC)    Hypertension    Past Surgical History:  Procedure Laterality Date   LYMPH NODE DISSECTION Bilateral 08/16/2022   Procedure: LYMPH NODE DISSECTION;  Surgeon: Alvaro Hummer, MD;  Location: [facility];  Service: Urology;  Laterality: Bilateral;   NO PAST SURGERIES     ROBOT ASSISTED LAPAROSCOPIC RADICAL PROSTATECTOMY N/A 08/16/2022   Procedure: XI ROBOTIC ASSISTED LAPAROSCOPIC RADICAL PROSTATECTOMY WITH INDOCYANINE GREEN DYE;  Surgeon: Alvaro Hummer, MD;  Location: [facility];  Service: Urology;  Laterality: N/A;   Patient Active Problem List   Diagnosis Date Noted   Chronic right shoulder pain 09/11/2024   Encounter for general adult medical examination with abnormal findings 06/09/2024   Class 2 obesity without serious comorbidity with body mass index (BMI) of 35.0 to 35.9 in adult 06/09/2024   Acute pain of right shoulder 06/09/2024   Primary hypertension 12/29/2022   Prostate cancer (HCC) 08/16/2022   Malignant neoplasm of prostate (HCC) 06/21/2022   Elevated serum creatinine 03/09/2022   Hyperlipidemia 03/09/2022   Type 2 diabetes mellitus with hyperglycemia, without long-term current use of insulin  (HCC) 03/09/2022    PCP: Deitra Morton Sebastian Nena, NP REFERRING  PROVIDER: Burnetta Brunet, DO  ONSET DATE: ~5 months  REFERRING DIAG: M75.01 (ICD-10-CM) - Adhesive capsulitis of right shoulder   THERAPY DIAG:  Chronic right shoulder pain  Shoulder stiffness, right  Other symptoms and signs involving the musculoskeletal system  Rationale for Evaluation and Treatment: Rehabilitation  SUBJECTIVE:   SUBJECTIVE STATEMENT: I still don't have good movement Pt accompanied by: self  PERTINENT HISTORY: Pt reports R shoulder pain and limited movement for 5 months, no fall, injury, or trauma prior to noticing pain. He has received 2 steroid injections.   PRECAUTIONS: None  WEIGHT BEARING RESTRICTIONS: No  PAIN:  Are you having pain? No - Pt reports having pain with movement  FALLS: Has patient fallen in last 6 months? No  PLOF: Independent  PATIENT GOALS: To get use back in the arm  NEXT MD VISIT: April 2026  OBJECTIVE:   HAND DOMINANCE: Right  ADLs: Overall ADLs: Pt is unable to reach up or behind his back. He reports donning and doffing his shirt is max difficulty, unable to put a belt on. Pt reports he also struggles with all aspects of bathing and drying off after his shower. Pt unable to lift anything heavy limiting all IADL's, especially cleaning and yard work.   UPPER EXTREMITY ROM:       Assessed in seated, er/IR adducted  Active ROM Right eval  Shoulder flexion 101  Shoulder abduction 84  Shoulder internal rotation 90  Shoulder  external rotation 15  (Blank rows = not tested)    UPPER EXTREMITY MMT:     Assessed in seated, er/IR adducted  MMT Right eval  Shoulder flexion 4/5  Shoulder abduction 4-/5  Shoulder internal rotation 5/5  Shoulder external rotation 3+/5  (Blank rows = not tested)  SENSATION: Slight tingling down through 5th digit intermittently  EDEMA: No swelling noted  OBSERVATIONS: Moderate fascial restrictions along the biceps and trapezius    TODAY'S TREATMENT:                                                                                                                               DATE:   12/17/24 -Pendulums: 2x30 -Table Slides: flexion, abduction, x10 -reviewed MD HEP    PATIENT EDUCATION: Education details: Pendulums and Table Slides Person educated: Patient Education method: Explanation, Demonstration, and Handouts Education comprehension: verbalized understanding and returned demonstration  HOME EXERCISE PROGRAM: 2/4: Pendulums and Table Slides  GOALS: Goals reviewed with patient? Yes   SHORT TERM GOALS: Target date: 02/13/25  Pt will be provided with and educated on HEP to improve mobility in RUE required for use during ADL completion.   Goal status: INITIAL  LONG TERM GOALS: Target date: 02/13/25  Pt will decrease pain in RUE to 3/10 or less to improve ability to sleep for 2+ consecutive hours without waking due to pain.   Goal status: INITIAL  2.  Pt will decrease RUE fascial restrictions to min amounts or less to improve mobility required for functional reaching tasks.   Goal status: INITIAL  3.  Pt will increase RUE A/ROM by 35 degrees to improve ability to use RUE when reaching overhead or behind back during dressing and bathing tasks.   Goal status: INITIAL  4.  Pt will increase RUE strength to 5/5 or greater to improve ability to use RUE when lifting or carrying items during meal preparation/housework/yardwork tasks.   Goal status: INITIAL  5.  Pt will return to highest level of function using RUE as dominant during functional task completion.   Goal status: INITIAL   ASSESSMENT:  CLINICAL IMPRESSION: Patient is a 54 y.o. male who was seen today for occupational therapy evaluation for R shoulder pain. Pt presents with increased pain and fascial restrictions, decreased ROM, strength, and functional use of the RUE.   PERFORMANCE DEFICITS: in functional skills including in functional skills including ADLs, IADLs, coordination, tone, ROM,  strength, pain, fascial restrictions, muscle spasms, and UE functional use.  IMPAIRMENTS: are limiting patient from ADLs, IADLs, rest and sleep, work, leisure, and social participation.   COMORBIDITIES: may have co-morbidities  that affects occupational performance. Patient will benefit from skilled OT to address above impairments and improve overall function.  MODIFICATION OR ASSISTANCE TO COMPLETE EVALUATION: No modification of tasks or assist necessary to complete an evaluation.  OT OCCUPATIONAL PROFILE AND HISTORY: Problem focused assessment: Including review of records relating to presenting problem.  CLINICAL DECISION MAKING:  LOW - limited treatment options, no task modification necessary  REHAB POTENTIAL: Good  EVALUATION COMPLEXITY: Low      PLAN:  OT FREQUENCY: 2x/week  OT DURATION: 6 weeks  PLANNED INTERVENTIONS: 97168 OT Re-evaluation, 97535 self care/ADL training, 02889 therapeutic exercise, 97530 therapeutic activity, 97112 neuromuscular re-education, 97140 manual therapy, 97035 ultrasound, 97010 moist heat, 97032 electrical stimulation (manual), passive range of motion, functional mobility training, energy conservation, coping strategies training, patient/family education, and DME and/or AE instructions  RECOMMENDED OTHER SERVICES: N/A  CONSULTED AND AGREED WITH PLAN OF CARE: Patient  PLAN FOR NEXT SESSION: Manual therapy, P/ROM, Isometrics, AA/ROM as able  Andres Carter, OTR/L [facility] [phone] Andres Carter, OT 12/17/2024, 4:08 PM   "

## 2024-12-17 NOTE — Patient Instructions (Signed)

## 2024-12-31 ENCOUNTER — Ambulatory Visit: Admitting: Family Medicine

## 2025-01-13 ENCOUNTER — Ambulatory Visit (HOSPITAL_COMMUNITY): Admitting: Occupational Therapy

## 2025-01-16 ENCOUNTER — Ambulatory Visit (HOSPITAL_COMMUNITY): Admitting: Occupational Therapy

## 2025-01-19 ENCOUNTER — Ambulatory Visit (HOSPITAL_COMMUNITY): Admitting: Occupational Therapy

## 2025-01-21 ENCOUNTER — Ambulatory Visit (HOSPITAL_COMMUNITY): Admitting: Occupational Therapy

## 2025-01-26 ENCOUNTER — Ambulatory Visit (HOSPITAL_COMMUNITY): Admitting: Occupational Therapy

## 2025-01-29 ENCOUNTER — Ambulatory Visit (HOSPITAL_COMMUNITY): Admitting: Occupational Therapy

## 2025-02-02 ENCOUNTER — Ambulatory Visit (HOSPITAL_COMMUNITY): Admitting: Occupational Therapy

## 2025-02-05 ENCOUNTER — Ambulatory Visit (HOSPITAL_COMMUNITY): Admitting: Occupational Therapy

## 2025-02-09 ENCOUNTER — Ambulatory Visit: Admitting: Sports Medicine

## 2025-02-10 ENCOUNTER — Ambulatory Visit (HOSPITAL_COMMUNITY): Admitting: Occupational Therapy

## 2025-02-12 ENCOUNTER — Ambulatory Visit (HOSPITAL_COMMUNITY): Admitting: Occupational Therapy

## 2025-02-17 ENCOUNTER — Ambulatory Visit (HOSPITAL_COMMUNITY): Admitting: Occupational Therapy

## 2025-02-19 ENCOUNTER — Ambulatory Visit (HOSPITAL_COMMUNITY): Admitting: Occupational Therapy
